# Patient Record
Sex: Female | Born: 1965 | Race: Black or African American | Hispanic: No | Marital: Single | Smoking: Current some day smoker
Health system: Southern US, Community
[De-identification: ages and names within clinical notes are randomized; demographics above are authoritative.]

## PROBLEM LIST (undated history)

## (undated) DIAGNOSIS — M1990 Unspecified osteoarthritis, unspecified site: Secondary | ICD-10-CM

## (undated) DIAGNOSIS — K219 Gastro-esophageal reflux disease without esophagitis: Secondary | ICD-10-CM

## (undated) DIAGNOSIS — G8929 Other chronic pain: Secondary | ICD-10-CM

## (undated) DIAGNOSIS — Z5189 Encounter for other specified aftercare: Secondary | ICD-10-CM

## (undated) DIAGNOSIS — E7800 Pure hypercholesterolemia, unspecified: Secondary | ICD-10-CM

## (undated) HISTORY — DX: Unspecified osteoarthritis, unspecified site: M19.90

## (undated) HISTORY — DX: Other chronic pain: G89.29

## (undated) HISTORY — PX: TUBAL LIGATION: SHX77

## (undated) HISTORY — PX: COLONOSCOPY: SHX174

## (undated) HISTORY — DX: Encounter for other specified aftercare: Z51.89

---

## 1994-01-07 DIAGNOSIS — Z5189 Encounter for other specified aftercare: Secondary | ICD-10-CM

## 1994-01-07 HISTORY — DX: Encounter for other specified aftercare: Z51.89

## 2001-10-05 ENCOUNTER — Encounter (HOSPITAL_COMMUNITY): Admission: RE | Admit: 2001-10-05 | Discharge: 2001-11-04 | Payer: Self-pay | Admitting: General Surgery

## 2001-10-05 ENCOUNTER — Encounter: Payer: Self-pay | Admitting: General Surgery

## 2002-03-20 ENCOUNTER — Emergency Department (HOSPITAL_COMMUNITY): Admission: EM | Admit: 2002-03-20 | Discharge: 2002-03-20 | Payer: Self-pay | Admitting: Emergency Medicine

## 2002-07-20 ENCOUNTER — Encounter: Payer: Self-pay | Admitting: Emergency Medicine

## 2002-07-20 ENCOUNTER — Emergency Department (HOSPITAL_COMMUNITY): Admission: EM | Admit: 2002-07-20 | Discharge: 2002-07-20 | Payer: Self-pay | Admitting: Emergency Medicine

## 2003-03-10 ENCOUNTER — Ambulatory Visit (HOSPITAL_COMMUNITY): Admission: RE | Admit: 2003-03-10 | Discharge: 2003-03-10 | Payer: Self-pay | Admitting: General Surgery

## 2003-03-17 ENCOUNTER — Ambulatory Visit (HOSPITAL_COMMUNITY): Admission: RE | Admit: 2003-03-17 | Discharge: 2003-03-17 | Payer: Self-pay | Admitting: General Surgery

## 2004-09-12 ENCOUNTER — Emergency Department (HOSPITAL_COMMUNITY): Admission: EM | Admit: 2004-09-12 | Discharge: 2004-09-12 | Payer: Self-pay | Admitting: Emergency Medicine

## 2005-01-23 ENCOUNTER — Emergency Department (HOSPITAL_COMMUNITY): Admission: EM | Admit: 2005-01-23 | Discharge: 2005-01-23 | Payer: Self-pay | Admitting: Emergency Medicine

## 2006-02-21 ENCOUNTER — Ambulatory Visit (HOSPITAL_COMMUNITY): Admission: RE | Admit: 2006-02-21 | Discharge: 2006-02-21 | Payer: Self-pay | Admitting: Family Medicine

## 2006-03-14 ENCOUNTER — Ambulatory Visit: Payer: Self-pay | Admitting: Gastroenterology

## 2006-03-19 ENCOUNTER — Ambulatory Visit: Payer: Self-pay | Admitting: Gastroenterology

## 2006-03-19 ENCOUNTER — Ambulatory Visit (HOSPITAL_COMMUNITY): Admission: RE | Admit: 2006-03-19 | Discharge: 2006-03-19 | Payer: Self-pay | Admitting: Gastroenterology

## 2006-03-19 HISTORY — PX: ESOPHAGOGASTRODUODENOSCOPY: SHX1529

## 2006-03-24 ENCOUNTER — Ambulatory Visit: Payer: Self-pay | Admitting: Orthopedic Surgery

## 2006-04-03 ENCOUNTER — Encounter (HOSPITAL_COMMUNITY): Admission: RE | Admit: 2006-04-03 | Discharge: 2006-05-03 | Payer: Self-pay | Admitting: Orthopedic Surgery

## 2006-05-06 ENCOUNTER — Encounter (HOSPITAL_COMMUNITY): Admission: RE | Admit: 2006-05-06 | Discharge: 2006-06-05 | Payer: Self-pay | Admitting: Orthopedic Surgery

## 2006-05-12 ENCOUNTER — Ambulatory Visit: Payer: Self-pay | Admitting: Gastroenterology

## 2006-05-12 ENCOUNTER — Ambulatory Visit: Payer: Self-pay | Admitting: Orthopedic Surgery

## 2006-05-22 ENCOUNTER — Ambulatory Visit (HOSPITAL_COMMUNITY): Admission: RE | Admit: 2006-05-22 | Discharge: 2006-05-22 | Payer: Self-pay | Admitting: Orthopedic Surgery

## 2006-05-29 ENCOUNTER — Ambulatory Visit: Payer: Self-pay | Admitting: Orthopedic Surgery

## 2006-09-01 ENCOUNTER — Ambulatory Visit: Payer: Self-pay | Admitting: Orthopedic Surgery

## 2006-09-15 ENCOUNTER — Encounter: Admission: RE | Admit: 2006-09-15 | Discharge: 2006-09-15 | Payer: Self-pay | Admitting: Orthopedic Surgery

## 2007-03-02 ENCOUNTER — Other Ambulatory Visit: Admission: RE | Admit: 2007-03-02 | Discharge: 2007-03-02 | Payer: Self-pay | Admitting: Obstetrics & Gynecology

## 2007-04-12 ENCOUNTER — Emergency Department (HOSPITAL_COMMUNITY): Admission: EM | Admit: 2007-04-12 | Discharge: 2007-04-12 | Payer: Self-pay | Admitting: Emergency Medicine

## 2008-02-01 ENCOUNTER — Other Ambulatory Visit: Admission: RE | Admit: 2008-02-01 | Discharge: 2008-02-01 | Payer: Self-pay | Admitting: Obstetrics & Gynecology

## 2008-02-10 ENCOUNTER — Ambulatory Visit (HOSPITAL_COMMUNITY): Admission: RE | Admit: 2008-02-10 | Discharge: 2008-02-10 | Payer: Self-pay | Admitting: Obstetrics & Gynecology

## 2008-07-20 ENCOUNTER — Ambulatory Visit (HOSPITAL_COMMUNITY): Admission: RE | Admit: 2008-07-20 | Discharge: 2008-07-20 | Payer: Self-pay | Admitting: Family Medicine

## 2009-02-10 ENCOUNTER — Ambulatory Visit (HOSPITAL_COMMUNITY): Admission: RE | Admit: 2009-02-10 | Discharge: 2009-02-10 | Payer: Self-pay | Admitting: Family Medicine

## 2009-03-24 ENCOUNTER — Other Ambulatory Visit: Admission: RE | Admit: 2009-03-24 | Discharge: 2009-03-24 | Payer: Self-pay | Admitting: Obstetrics & Gynecology

## 2009-12-20 ENCOUNTER — Emergency Department (HOSPITAL_COMMUNITY)
Admission: EM | Admit: 2009-12-20 | Discharge: 2009-12-20 | Payer: Self-pay | Source: Home / Self Care | Admitting: Emergency Medicine

## 2010-01-24 ENCOUNTER — Ambulatory Visit (HOSPITAL_COMMUNITY)
Admission: RE | Admit: 2010-01-24 | Discharge: 2010-01-24 | Payer: Self-pay | Source: Home / Self Care | Attending: Family Medicine | Admitting: Family Medicine

## 2010-01-25 ENCOUNTER — Other Ambulatory Visit (HOSPITAL_COMMUNITY): Payer: Self-pay | Admitting: Family Medicine

## 2010-01-25 DIAGNOSIS — Z139 Encounter for screening, unspecified: Secondary | ICD-10-CM

## 2010-01-28 ENCOUNTER — Encounter: Payer: Self-pay | Admitting: General Surgery

## 2010-01-29 ENCOUNTER — Encounter: Payer: Self-pay | Admitting: Orthopedic Surgery

## 2010-02-12 ENCOUNTER — Ambulatory Visit (HOSPITAL_COMMUNITY)
Admission: RE | Admit: 2010-02-12 | Discharge: 2010-02-12 | Disposition: A | Discharge status: Home / Self Care | Payer: Medicare Other | Source: Ambulatory Visit | Attending: Family Medicine | Admitting: Family Medicine

## 2010-02-12 ENCOUNTER — Ambulatory Visit (HOSPITAL_COMMUNITY): Admission: RE | Admit: 2010-02-12 | Payer: Medicare Other | Source: Home / Self Care | Admitting: Family Medicine

## 2010-02-12 DIAGNOSIS — Z139 Encounter for screening, unspecified: Secondary | ICD-10-CM

## 2010-02-12 DIAGNOSIS — Z1231 Encounter for screening mammogram for malignant neoplasm of breast: Secondary | ICD-10-CM | POA: Insufficient documentation

## 2010-03-27 ENCOUNTER — Other Ambulatory Visit (HOSPITAL_COMMUNITY)
Admission: RE | Admit: 2010-03-27 | Discharge: 2010-03-27 | Disposition: A | Discharge status: Home / Self Care | Payer: Medicare Other | Source: Ambulatory Visit | Attending: Obstetrics & Gynecology | Admitting: Obstetrics & Gynecology

## 2010-03-27 ENCOUNTER — Other Ambulatory Visit: Payer: Self-pay | Admitting: Obstetrics & Gynecology

## 2010-03-27 DIAGNOSIS — Z124 Encounter for screening for malignant neoplasm of cervix: Secondary | ICD-10-CM | POA: Insufficient documentation

## 2010-05-22 NOTE — Consult Note (Signed)
Kelsey Grant, Kelsey Grant           ACCOUNT NO.:  [id number]   MEDICAL RECORD NO.:  [id number]          PATIENT TYPE:  DAY   LOCATION:  AMB                           FACILITY:  [facility]   PHYSICIAN:  Kassie Mends, M.D.      DATE OF BIRTH:  1965-12-18   DATE OF CONSULTATION:  05/12/2006  DATE OF DISCHARGE:                                 CONSULTATION   REFERRING PHYSICIAN:  Oval Linsey, MD.   PROBLEM LIST:  1. Esophageal stricture dilated in March 2008.  2. Gastroesophageal reflux disease.  3. Family history of colorectal cancer diagnosed at age less than 23      in a first-degree relative.   SUBJECTIVE:  Kelsey Grant is a 45 year old female who presented with  solid dysphagia.  She had Savary dilation in March 2008  to 15 mm.  She  states she that has been eating pretty good.  She has been burping a  lot.  She is able to eat chicken and fish.  Her chicken is boiled or  baked, but in the form of chicken breast.  She does have a couple of  molars missing in the back and her top set of teeth.  She denies any  food getting stuck when it goes down.  She does have uncontrolled reflux  if she does not take her omeprazole.  She feels like foods comes back  into her esophagus if she is not taking her omeprazole.   MEDICATIONS:  1. Omeprazole 20 mg daily.  2. Hydrocodone 5/500 as needed for hip pain.  3. Naproxen fiber mg twice a day as needed for hip pain.   OBJECTIVE:  Weight 148 pounds (unchanged since March 2008), height 5  feet 2 inches, temperature 98.3, blood pressure 110/76, pulse 60.  GENERAL:  She is in no apparent distress, alert and oriented x4.  LUNGS:  Clear to auscultation bilaterally.  CARDIOVASCULAR:  Regular rhythm.  No murmur.  Normal S1 and S2.  ABDOMEN:  Bowel sounds present, soft, nontender, nondistended.  No  rebound or guarding.   ASSESSMENT:  Kelsey Grant is a 45 year old female with solid dysphagia  who was dilated to 15 mm, who has less than ideal dentition  and would  benefit from additional dilation.  She is unable to be sedated  adequately with conscious sedation and has failed multiple attempts to  be sedated in the past with conscious sedation.  She was successfully  sedated in March 2008 with propofol.  Her symptoms are adequately  controlled with omeprazole 20 mg daily.   Thank you for allowing me to see Kelsey Grant in consultation.  My  recommendations follow.   RECOMMENDATIONS:  1..  EGD with Savary dilation and sedation provided  with propofol within the next 2 weeks.  1. Continue omeprazole for gastroesophageal reflux disease.  She was      also given a [employer] for self-      care for gastroesophageal reflux disease.  2. She was given a gas and prevention [facility] gastroenterology      handout.  3. Return patient visit to 2 months.  Kassie Mends, M.D.     SM/MEDQ  D:  05/12/2006  T:  05/12/2006  Job:  [id number]   cc:   Melvyn Novas, MD  Fax: [phone]

## 2010-05-22 NOTE — Consult Note (Signed)
NAMEDESHONDRA, WORST           ACCOUNT NO.:  [id number]   MEDICAL RECORD NO.:  [id number]          PATIENT TYPE:  DAY   LOCATION:  AMB                           FACILITY:  [facility]   PHYSICIAN:  Kassie Mends, M.D.      DATE OF BIRTH:  1965-01-09   DATE OF CONSULTATION:  05/12/2006  DATE OF DISCHARGE:                                 CONSULTATION   REFERRING PHYSICIAN:  Oval Linsey, MD.   PROBLEM LIST:  1. Esophageal stricture dilated in [id number].  2. Gastroesophageal reflux disease.  3. Family history of colorectal cancer diagnosed at age less than 28      in a first-degree relative.   SUBJECTIVE:  Ms. Baldridge is a 45 year old female who presented with  solid dysphagia.  She had Savary dilation in March 2008  to 15 mm.  She  states she that has been eating pretty good.  She has been burping a  lot.  She is able to eat chicken and fish.  Her chicken is boiled or  baked, but in the form of chicken breast.  She does have a couple of  molars missing in the back and her top set of teeth.  She denies any  food getting stuck when it goes down.  She does have uncontrolled reflux  if she does not take her omeprazole.  She feels like foods comes back  into her esophagus if she is not taking her omeprazole.   MEDICATIONS:  1. Omeprazole 20 mg daily.  2. Hydrocodone 5/500 as needed for hip pain.  3. Naproxen 500 mg twice a day as needed for hip pain.   OBJECTIVE:  Weight 148 pounds (unchanged since [id number]), height 5  feet 2 inches, temperature 98.3, blood pressure 110/76, pulse 60.  GENERAL:  She is in no apparent distress, alert and oriented x4.  LUNGS:  Clear to auscultation bilaterally.  CARDIOVASCULAR:  Regular rhythm.  No  murmur.  Normal S1 and S2.  ABDOMEN:  Bowel sounds present, soft, nontender, nondistended.  No  rebound or guarding.   ASSESSMENT:  Ms. Weill is a 45 year old female with solid dysphagia  who was dilated to 15 mm, who has less than ideal dentition and  would  benefit from additional dilation.  She  has failed multiple attempts to  be sedated in the past with conscious sedation.  She was successfully  sedated in [id number] with propofol.  Her symptoms are adequately  controlled with omeprazole 20 mg daily.  Thank you for allowing me to  see Ms. Sheehy in consultation.  My recommendations follow.   RECOMMENDATIONS:  1. EGD with Savary dilation and sedation provided with propofol within      the next 2 weeks.  2. Continue omeprazole for gastroesophageal reflux disease.  She was      also given a [employer] for self-      care for gastroesophageal reflux disease.  3. She was given a gas and prevention [facility] gastroenterology      handout.  4. Return patient visit to 2 months.      Kassie Mends, M.D.  Electronically Signed  SM/MEDQ  D:  05/12/2006  T:  05/12/2006  Job:  [id number]   cc:   Melvyn Novas, MD  Fax: [phone]

## 2010-05-25 NOTE — Op Note (Signed)
Kelsey Grant, QUANT           ACCOUNT NO.:  [id number]   MEDICAL RECORD NO.:  [id number]          PATIENT TYPE:  AMB   LOCATION:  DAY                           FACILITY:  [facility]   PHYSICIAN:  Kassie Mends, M.D.      DATE OF BIRTH:  03-26-1965   DATE OF PROCEDURE:  03/19/2006  DATE OF DISCHARGE:  03/19/2006                               OPERATIVE REPORT   REFERRING PHYSICIAN:  Melvyn Novas, MD   PROCEDURE:  Esophagogastroduodenoscopy with Savary dilation.   INDICATION FOR EXAM:  Kelsey Grant is a 45 year old female who presents  with dysphagia.   FINDINGS:  1. Distal esophageal stricture.  Savary dilation performed      successively with the Savary dilators from 12.8 mm to 15 mm with      increasing resistance.  The last dilator passed with moderate      resistance.  A small tear was created in the proximal gastric wall      when the retroflexed view of the fundus and cardia was performed.  2. Otherwise, the distal esophagus was without evidence of Barrett's,      the body and the antrum of the stomach were without erosion or      ulcerations.  Normal duodenal bulb and second portion of the      duodenum.   RECOMMENDATIONS:  1. Clear liquids for 12 hours then soft mechanical diet.  All meats      need to be shredded, chopped or pulled.  2. No aspirin, NSAIDs or anticoagulation for 14 days.  3. Follow-up appointment with Dr. Cira Servant in 1 month.   MEDICATIONS:  Provided by anesthesia due to the patient having failed  attempt at two EGD via conscious sedation due to agitation and inability  to be sedated via conscious sedation.   PROCEDURE TECHNIQUE:  Physical exam was performed and informed consent  was obtained from the patient after explaining the benefits, risks and  alternatives to the procedure.  The patient was connected to monitor and  placed in the left lateral position.  Continuous oxygen was provided by  nasal cannula and IV medicine administered through  an indwelling  cannula.  After administration of sedation by anesthesia, the patient's  esophagus intubated and  the scope was advanced under direct visualization to second portion of  the duodenum.  The scope was subsequently removed slowly by carefully  examining the color, texture, anatomy and integrity of the mucosa on the  way out.  The patient was recovered in endoscopy suite and discharged  home in satisfactory condition.      Kassie Mends, M.D.  Electronically Signed     SM/MEDQ  D:  03/25/2006  T:  03/26/2006  Job:  [id number]   cc:   Melvyn Novas, MD  Fax: [phone]

## 2010-05-25 NOTE — Consult Note (Signed)
NAMEELIZABETHANNE, Grant           ACCOUNT NO.:  [id number]   MEDICAL RECORD NO.:  [id number]          PATIENT TYPE:  AMB   LOCATION:  DAY                           FACILITY:  [facility]   PHYSICIAN:  Kassie Mends, M.D.      DATE OF BIRTH:  04/19/1965   DATE OF CONSULTATION:  03/14/2006  DATE OF DISCHARGE:                                 CONSULTATION   REFERRING PHYSICIAN:  Melvyn Novas, M.D.   REASON FOR CONSULTATION:  Esophageal stricture.   HISTORY OF PRESENT ILLNESS:  [name] is a 45 year old female who  complains of food stopping in the middle of her chest.  She states that  Dr. Katrinka Blazing tried twice to do an upper endoscopy on her within the last  year or two, but she was unable to be sedated because she kept fighting  and the procedure was never performed.  She has just managed her  symptoms by eating and waiting for the food to go down.  She does have a  history of heartburn and she takes omeprazole 20 mg a day.  She denies  any blood in her stool or black tarry stools.  She has not had any  abdominal pain, constipation, or diarrhea.   PAST MEDICAL HISTORY:  Thumb fracture.   PAST SURGICAL HISTORY:  Laser surgery.   ALLERGIES:  NO KNOWN DRUG ALLERGIES.   MEDICATIONS:  1. Omeprazole 20 mg daily.  2. Hydrocodone 5/500, three times a day as needed for hip pain.  3. Naproxen 500 mg b.i.d. as needed for hip pain.   FAMILY HISTORY:  She has a family history of colorectal cancer in her  father at age less than 37.  She is not sure of the exact age when he  was diagnosed.   SOCIAL HISTORY:  She is not married and not employed.  She smokes  sometimes.  When asked about her alcohol, she states she does not really  drink that often.   REVIEW OF SYSTEMS:  Per the HPI, otherwise all systems are negative.   PHYSICAL EXAMINATION:  VITAL SIGNS:  Weight 148 pounds, height 5 feet 2  inches, BMI of 27.1 (overweight), temperature 97.9, blood pressure  120/76, pulse 68.   GENERAL:  She is in no apparent distress, alert and  orient x4.  HEENT:  Atraumatic, normocephalic.  Pupils equal and reactive to light.  Mouth:  No oral lesions.  Posterior pharynx without erythema or  exudate.NECK:  Full range of motion.  No lymphadenopathy.  LUNGS:  Clear  to auscultation bilaterally.  CARDIOVASCULAR:  Regular rhythm.  No murmur.  Normal S1-S2.  ABDOMEN:  Bowel sounds are present.  Soft, nontender, nondistended.  No rebound,  no guarding, no hepatosplenomegaly, no abdominal bruits or pulsatile  masses.  EXTREMITIES:  Without cyanosis, clubbing or edema.  NEUROLOGIC:  She has  no focal neurologic deficits.   RADIOGRAPHIC STUDIES:  Upper GI performed on February15, 2008, shows  poor forward contrast peristalsis across the stricture GE junction which  obstructed a 12.5-mm diameter barium tablet.   ASSESSMENT:  Kelsey Grant is a 45 year old female with a distal  esophageal stricture, probably a secondary to gastroesophageal reflux  disease.  She has significant solid dysphagia.Thank you for allowing me  to see Ms. Donahoo in consultation.  My recommendations follow.   RECOMMENDATIONS:  1. Ms. Campisi will be scheduled for an EGD using propofol, because      she has already had two failed attempts at EGD using conscious      sedation.  2. She is asked to follow a soft diet.  She is only to eat chopped or      shredded needs.  3. I spent 15 minutes explaining to Ms. Vantol the nature of the      upper endoscopy, why we should do it with propofol, and why she      needs to follow a soft diet.  I did discuss with her the risk of      aspiration and explained what aspiration was if she does not follow      the soft diet recommendations.  She understands that if her      esophagus is full of food she may aspirate which is getting food      into her lungs and get pneumonia.  4. I also asked her to specifically ask her father what age he was      diagnosed with colon  cancer, and she knows then she should have a      colonoscopy 10 years younger than the age that he was diagnosed      with colon cancer.  She understands that her siblings, brothers and      sisters, should also have a colonoscopy at an age 48 years younger      than when he was diagnosed with his colon cancer.  5. I will obtain a CBC, BMP, and mag on today.  6. She has a followup appointment to see me in 2 months.      Kassie Mends, M.D.  Electronically Signed     SM/MEDQ  D:  03/14/2006  T:  03/14/2006  Job:  [id number]   cc:   Melvyn Novas, MD  Fax: [phone]

## 2010-05-25 NOTE — H&P (Signed)
NAME:  Kelsey Grant, Kelsey Grant                     ACCOUNT NO.:  [id number]   MEDICAL RECORD NO.:  [id number]                   PATIENT TYPE:  AMB   LOCATION:  DAY                                  FACILITY:  [facility]   PHYSICIAN:  Leroy C. Katrinka Blazing, M.D.                DATE OF BIRTH:  23-Jul-1965   DATE OF ADMISSION:  DATE OF DISCHARGE:                                HISTORY & PHYSICAL   HISTORY OF PRESENT ILLNESS:  69 five-year-old female with history of  severe gastroesophageal reflux disease.  She is having increasing heartburn  and dysphagia.  She has documented peptic ulcer disease and with an antral  gastritis.  She also has Grant history of distal esophageal ulcer with  stricture.  She has not been scoped over the past 5 years.  She is having  increasing dysphagia and is scheduled for upper endoscopy and probable  esophageal dilatation.   PAST MEDICAL HISTORY:  1. 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  2. She has chronic low back pain.  3. Hypertension.   MEDICATIONS:  1. Zanaflex 4 mg 2 tabs b.i.d.  2. Prevacid 30 mg daily.  3. Reglan 10 mg Grant.c. and h.s.   EXAMINATION:  VITAL SIGNS:  Blood pressure 120/80, pulse 68, respirations  16.  Weight 159 pounds.  HEENT:  Unremarkable.  NECK:  Neck is supple with no JVD or bruit.  CHEST:  Chest clear to auscultation.  No rales, rubs, rhonchi or wheezes.  HEART:  Regular rate and rhythm without murmur, gallop or rub.  ABDOMEN:  Abdomen soft and nontender.  No masses.  EXTREMITIES:  Decreased range of motion at the hips, mild tenderness to both  hips laterally and mild tenderness over SI joints.  NEUROLOGIC:  No focal motor, sensory or cerebellar deficit.   IMPRESSION:  1. Gastroesophageal reflux disease with progressive dysphagia.  2. Peptic ulcer disease with Grant history of acute gastritis.  3. Hypertension.  4. Osteoarthritis.  5. Chronic low back pain.   PLAN:  Upper endoscopy with probable esophageal dilatation.     ___________________________________________                                         Dirk Dress Katrinka Blazing, M.D.   LCS/MEDQ  D:  03/09/2003  T:  03/10/2003  Job:  [id number]

## 2010-05-26 ENCOUNTER — Emergency Department (HOSPITAL_COMMUNITY)
Admission: EM | Admit: 2010-05-26 | Discharge: 2010-05-26 | Disposition: A | Discharge status: Home / Self Care | Payer: No Typology Code available for payment source | Attending: Emergency Medicine | Admitting: Emergency Medicine

## 2010-05-26 DIAGNOSIS — S139XXA Sprain of joints and ligaments of unspecified parts of neck, initial encounter: Secondary | ICD-10-CM | POA: Insufficient documentation

## 2010-05-26 DIAGNOSIS — S336XXA Sprain of sacroiliac joint, initial encounter: Secondary | ICD-10-CM | POA: Insufficient documentation

## 2010-05-26 DIAGNOSIS — Y92410 Unspecified street and highway as the place of occurrence of the external cause: Secondary | ICD-10-CM | POA: Insufficient documentation

## 2010-07-16 ENCOUNTER — Emergency Department (HOSPITAL_COMMUNITY): Payer: Medicare Other

## 2010-07-16 ENCOUNTER — Emergency Department (HOSPITAL_COMMUNITY)
Admission: EM | Admit: 2010-07-16 | Discharge: 2010-07-16 | Disposition: A | Discharge status: Home / Self Care | Payer: Medicare Other | Attending: Emergency Medicine | Admitting: Emergency Medicine

## 2010-07-16 DIAGNOSIS — S161XXA Strain of muscle, fascia and tendon at neck level, initial encounter: Secondary | ICD-10-CM

## 2010-07-16 DIAGNOSIS — E7800 Pure hypercholesterolemia, unspecified: Secondary | ICD-10-CM | POA: Insufficient documentation

## 2010-07-16 DIAGNOSIS — S139XXA Sprain of joints and ligaments of unspecified parts of neck, initial encounter: Secondary | ICD-10-CM | POA: Insufficient documentation

## 2010-07-16 DIAGNOSIS — K219 Gastro-esophageal reflux disease without esophagitis: Secondary | ICD-10-CM | POA: Insufficient documentation

## 2010-07-16 DIAGNOSIS — Z79899 Other long term (current) drug therapy: Secondary | ICD-10-CM | POA: Insufficient documentation

## 2010-07-16 DIAGNOSIS — F17200 Nicotine dependence, unspecified, uncomplicated: Secondary | ICD-10-CM | POA: Insufficient documentation

## 2010-07-16 HISTORY — DX: Gastro-esophageal reflux disease without esophagitis: K21.9

## 2010-07-16 HISTORY — DX: Pure hypercholesterolemia, unspecified: E78.00

## 2010-07-16 MED ORDER — CELECOXIB 100 MG PO CAPS: 100.0000 mg | ORAL_CAPSULE | Freq: Two times a day (BID) | ORAL | Status: AC
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
## 2010-07-16 NOTE — ED Notes (Signed)
Pt presents with pain to neck, shoulders back, and pt states pain radiates down to feet. Pt states she was in [facility] 06/25/2010. Pt was seen here. Pt has since followed up with PMD and cleared but pt feels "something isn't right".

## 2010-10-25 ENCOUNTER — Encounter (HOSPITAL_COMMUNITY): Payer: Self-pay | Admitting: Emergency Medicine

## 2010-10-25 ENCOUNTER — Emergency Department (HOSPITAL_COMMUNITY)
Admission: EM | Admit: 2010-10-25 | Discharge: 2010-10-25 | Disposition: A | Discharge status: Home / Self Care | Payer: Medicare Other | Attending: Emergency Medicine | Admitting: Emergency Medicine

## 2010-10-25 ENCOUNTER — Other Ambulatory Visit: Payer: Self-pay

## 2010-10-25 DIAGNOSIS — M25511 Pain in right shoulder: Secondary | ICD-10-CM

## 2010-10-25 DIAGNOSIS — M25519 Pain in unspecified shoulder: Secondary | ICD-10-CM | POA: Insufficient documentation

## 2010-10-25 DIAGNOSIS — K219 Gastro-esophageal reflux disease without esophagitis: Secondary | ICD-10-CM | POA: Insufficient documentation

## 2010-10-25 DIAGNOSIS — M542 Cervicalgia: Secondary | ICD-10-CM | POA: Insufficient documentation

## 2010-10-25 DIAGNOSIS — Z87828 Personal history of other (healed) physical injury and trauma: Secondary | ICD-10-CM | POA: Insufficient documentation

## 2010-10-25 DIAGNOSIS — F17200 Nicotine dependence, unspecified, uncomplicated: Secondary | ICD-10-CM | POA: Insufficient documentation

## 2010-10-25 DIAGNOSIS — E7800 Pure hypercholesterolemia, unspecified: Secondary | ICD-10-CM | POA: Insufficient documentation

## 2010-10-25 DIAGNOSIS — M79601 Pain in right arm: Secondary | ICD-10-CM

## 2010-10-25 DIAGNOSIS — M79609 Pain in unspecified limb: Secondary | ICD-10-CM | POA: Insufficient documentation

## 2010-10-25 MED ORDER — DIAZEPAM 5 MG PO TABS
5.0000 mg | ORAL_TABLET | Freq: Once | ORAL | Status: DC
  Filled 2010-10-25: qty 1

## 2010-10-25 MED ORDER — IBUPROFEN 400 MG PO TABS
400.0000 mg | ORAL_TABLET | Freq: Once | ORAL | Status: AC
  Administered 2010-10-25: 400 mg via ORAL
  Filled 2010-10-25: qty 1

## 2010-10-25 NOTE — ED Notes (Signed)
Pt c/o right arm, breast and shoulder pain x 3 weeks.
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
## 2011-02-20 ENCOUNTER — Other Ambulatory Visit (HOSPITAL_COMMUNITY): Payer: Self-pay | Admitting: Family Medicine

## 2011-02-20 DIAGNOSIS — Z139 Encounter for screening, unspecified: Secondary | ICD-10-CM

## 2011-02-21 ENCOUNTER — Ambulatory Visit (HOSPITAL_COMMUNITY)
Admission: RE | Admit: 2011-02-21 | Discharge: 2011-02-21 | Disposition: A | Discharge status: Home / Self Care | Payer: Medicare Other | Source: Ambulatory Visit | Attending: Family Medicine | Admitting: Family Medicine

## 2011-02-21 DIAGNOSIS — Z139 Encounter for screening, unspecified: Secondary | ICD-10-CM

## 2011-02-21 DIAGNOSIS — Z1231 Encounter for screening mammogram for malignant neoplasm of breast: Secondary | ICD-10-CM | POA: Insufficient documentation

## 2011-03-04 ENCOUNTER — Encounter (HOSPITAL_COMMUNITY): Payer: Self-pay | Admitting: *Deleted

## 2011-03-04 ENCOUNTER — Emergency Department (HOSPITAL_COMMUNITY): Payer: Medicare Other

## 2011-03-04 ENCOUNTER — Emergency Department (HOSPITAL_COMMUNITY)
Admission: EM | Admit: 2011-03-04 | Discharge: 2011-03-04 | Disposition: A | Discharge status: Home / Self Care | Payer: Medicare Other | Attending: Emergency Medicine | Admitting: Emergency Medicine

## 2011-03-04 DIAGNOSIS — M5380 Other specified dorsopathies, site unspecified: Secondary | ICD-10-CM | POA: Insufficient documentation

## 2011-03-04 DIAGNOSIS — E7800 Pure hypercholesterolemia, unspecified: Secondary | ICD-10-CM | POA: Insufficient documentation

## 2011-03-04 DIAGNOSIS — W108XXA Fall (on) (from) other stairs and steps, initial encounter: Secondary | ICD-10-CM | POA: Insufficient documentation

## 2011-03-04 DIAGNOSIS — M542 Cervicalgia: Secondary | ICD-10-CM | POA: Insufficient documentation

## 2011-03-04 DIAGNOSIS — K219 Gastro-esophageal reflux disease without esophagitis: Secondary | ICD-10-CM | POA: Insufficient documentation

## 2011-03-04 DIAGNOSIS — M7918 Myalgia, other site: Secondary | ICD-10-CM

## 2011-03-04 DIAGNOSIS — M545 Low back pain, unspecified: Secondary | ICD-10-CM | POA: Insufficient documentation

## 2011-03-04 DIAGNOSIS — IMO0001 Reserved for inherently not codable concepts without codable children: Secondary | ICD-10-CM | POA: Insufficient documentation

## 2011-03-04 DIAGNOSIS — Z79899 Other long term (current) drug therapy: Secondary | ICD-10-CM | POA: Insufficient documentation

## 2011-03-04 DIAGNOSIS — M546 Pain in thoracic spine: Secondary | ICD-10-CM | POA: Insufficient documentation

## 2011-03-04 DIAGNOSIS — M25519 Pain in unspecified shoulder: Secondary | ICD-10-CM | POA: Insufficient documentation

## 2011-03-04 DIAGNOSIS — S335XXA Sprain of ligaments of lumbar spine, initial encounter: Secondary | ICD-10-CM | POA: Insufficient documentation

## 2011-03-04 DIAGNOSIS — Y92009 Unspecified place in unspecified non-institutional (private) residence as the place of occurrence of the external cause: Secondary | ICD-10-CM | POA: Insufficient documentation

## 2011-03-04 DIAGNOSIS — S39012A Strain of muscle, fascia and tendon of lower back, initial encounter: Secondary | ICD-10-CM

## 2011-03-04 DIAGNOSIS — M25559 Pain in unspecified hip: Secondary | ICD-10-CM | POA: Insufficient documentation

## 2011-03-04 MED ORDER — METHOCARBAMOL 500 MG PO TABS: 1000.0000 mg | ORAL_TABLET | Freq: Three times a day (TID) | ORAL | Status: AC

## 2011-03-04 NOTE — ED Notes (Signed)
Denies LOC

## 2011-03-04 NOTE — ED Notes (Signed)
Fell on Friday and next day had left sided neck pain and left leg pain, left side with pain per pt.

## 2011-03-04 NOTE — ED Notes (Signed)
Slipped on ice on Friday.  Pain in neck, lt side of body, especially lt hip area. Ambulatory, Alert, NAD.

## 2011-03-04 NOTE — ED Provider Notes (Signed)
History     CSN: [id number]  Arrival date & time 03/04/11  1101   First MD Initiated Contact with Patient 03/04/11 1234      Chief Complaint  Patient presents with  . Fall  . Neck Pain    (Consider location/radiation/quality/duration/timing/severity/associated sxs/prior treatment) Patient is a 46 y.o. female presenting with fall. The history is provided by the patient.  Fall The accident occurred 2 days ago. The fall occurred while walking (Patient slipped walking down her front steps 2 days ago.  She landed on her left side causing pain in her hip and lower back.  Yesterday she also developed pain and tightness in her upper back and neck area.). She landed on concrete. The point of impact was the left hip. Pain location: Lower back with radiation to left hip and buttock, left neck and upper shoulder. The pain is at a severity of 8/10. The pain is moderate. She was ambulatory at the scene. Pertinent negatives include no fever, no numbness, no abdominal pain, no bowel incontinence, no nausea, no headaches, no loss of consciousness and no tingling. The symptoms are aggravated by flexion and standing (Palpation and movement makes symptoms worse.). She has tried rest, heat and acetaminophen for the symptoms. The treatment provided no relief.    Past Medical History  Diagnosis Date  . GERD (gastroesophageal reflux disease)   . Hypercholesteremia     Past Surgical History  Procedure Date  . Cesarean section   . Bony pelvis surgery     No family history on file.  History  Substance Use Topics  . Smoking status: Former Smoker    Types: Cigars  . Smokeless tobacco: Not on file  . Alcohol Use: Yes    OB History    Grav Para Term Preterm Abortions TAB SAB Ect Mult Living                  Review of Systems  Constitutional: Negative for fever.  HENT: Negative for congestion, sore throat and neck pain.   Eyes: Negative.   Respiratory: Negative for chest tightness and shortness  of breath.   Cardiovascular: Negative for chest pain.  Gastrointestinal: Negative for nausea, abdominal pain and bowel incontinence.  Genitourinary: Negative.   Musculoskeletal: Positive for back pain and arthralgias. Negative for joint swelling and gait problem.  Skin: Negative.  Negative for rash and wound.  Neurological: Negative for dizziness, tingling, loss of consciousness, weakness, light-headedness, numbness and headaches.  Hematological: Negative.   Psychiatric/Behavioral: Negative.     Allergies  Review of patient's allergies indicates no known allergies.  Home Medications   Current Outpatient Rx  Name Route Sig Dispense Refill  . ACETAMINOPHEN 500 MG PO TABS Oral Take 500 mg by mouth every 6 (six) hours as needed. Pain     . METHOCARBAMOL 500 MG PO TABS Oral Take 2 tablets (1,000 mg total) by mouth 3 (three) times daily. 30 tablet 0  . NABUMETONE 750 MG PO TABS Oral Take 750 mg by mouth 2 (two) times daily.     Marland Kitchen OMEPRAZOLE 10 MG PO CPDR Oral Take 20 mg by mouth daily.     . OXYCODONE-ACETAMINOPHEN 5-325 MG PO TABS Oral Take 1 tablet by mouth 3 (three) times daily as needed. Pain    . PRAVASTATIN SODIUM 40 MG PO TABS Oral Take 40 mg by mouth daily.       BP 134/86  Pulse 69  Temp(Src) 97.8 F (36.6 C) (Oral)  Resp 20  Ht 5\' 2"  (1.575 m)  Wt 155 lb (70.308 kg)  BMI 28.35 kg/m2  SpO2 100%  LMP 02/25/2011  Physical Exam  Nursing note and vitals reviewed. Constitutional: She is oriented to person, place, and time. She appears well-developed and well-nourished.  HENT:  Head: Normocephalic.  Eyes: Conjunctivae are normal.  Neck: Normal range of motion. Neck supple.  Cardiovascular: Regular rhythm and intact distal pulses.        Pedal pulses normal.  Pulmonary/Chest: Effort normal. She has no wheezes.  Abdominal: Soft. Bowel sounds are normal. She exhibits no distension and no mass.  Musculoskeletal: She exhibits no edema.       Cervical back: She exhibits  decreased range of motion, tenderness and spasm. She exhibits no bony tenderness.       Lumbar back: She exhibits tenderness. She exhibits no swelling, no edema and no spasm.  Neurological: She is alert and oriented to person, place, and time. She has normal strength. She displays no atrophy and no tremor. No cranial nerve deficit or sensory deficit. Gait normal.  Reflex Scores:      Patellar reflexes are 2+ on the right side and 2+ on the left side.      Achilles reflexes are 2+ on the right side and 2+ on the left side.      No strength deficit noted in hip and knee flexor and extensor muscle groups.  Ankle flexion and extension intact.  Skin: Skin is warm and dry.  Psychiatric: She has a normal mood and affect.    ED Course  Procedures (including critical care time)  Labs Reviewed - No data to display Dg Cervical Spine Complete  03/04/2011  *RADIOLOGY REPORT*  Clinical Data: Pain, left-sided  CERVICAL SPINE - COMPLETE 4+ VIEW  Comparison: 07/16/2010  Findings: Alignment is normal.  Soft tissue shadows are normal.  No evidence of degenerative disc disease or degenerative facet disease.  No osteophytic encroachment upon the canal or foramina.  IMPRESSION: Normal radiographs  Original Report Authenticated By: Thomasenia Sales, M.D.   Dg Lumbar Spine Complete  03/04/2011  *RADIOLOGY REPORT*  Clinical Data: Back pain extending to the left leg  LUMBAR SPINE - COMPLETE 4+ VIEW  Comparison: 05/22/2006  Findings: There is very minimal curvature convex to the right. Disc space heights are within normal limits.  There is mild facet degeneration at L4-5 and L5-S1.  No pars defect or slippage. Sacroiliac joints appear normal.  IMPRESSION: Mild facet degeneration L4-5 and L5-S1.  Original Report Authenticated By: Thomasenia Sales, M.D.     1. Myofascial muscle pain   2. Lumbar strain       MDM  Robaxin added to patient's medication regimen.  Encouraged to continue with the Relafen and oxycodone when  necessary.  Heat therapy, avoid movement and activity that worsens pain.  Referral to Dr. Eduard Clos for further management if not improving over the next week.        Candis Musa, PA 03/05/11 [phone]

## 2011-03-05 NOTE — ED Provider Notes (Signed)
Medical screening examination/treatment/procedure(s) were performed by non-physician practitioner and as supervising physician I was immediately available for consultation/collaboration.  Nicoletta Dress. Colon Branch, MD 03/05/11 1052

## 2011-04-02 ENCOUNTER — Other Ambulatory Visit (HOSPITAL_COMMUNITY)
Admission: RE | Admit: 2011-04-02 | Discharge: 2011-04-02 | Disposition: A | Discharge status: Home / Self Care | Payer: Medicare Other | Source: Ambulatory Visit | Attending: Obstetrics & Gynecology | Admitting: Obstetrics & Gynecology

## 2011-04-02 ENCOUNTER — Other Ambulatory Visit: Payer: Self-pay | Admitting: Obstetrics & Gynecology

## 2011-04-02 DIAGNOSIS — Z124 Encounter for screening for malignant neoplasm of cervix: Secondary | ICD-10-CM | POA: Insufficient documentation

## 2011-04-23 ENCOUNTER — Encounter: Payer: Self-pay | Admitting: Gastroenterology

## 2011-04-24 ENCOUNTER — Encounter: Payer: Self-pay | Admitting: Gastroenterology

## 2011-04-24 ENCOUNTER — Ambulatory Visit (INDEPENDENT_AMBULATORY_CARE_PROVIDER_SITE_OTHER): Payer: Medicare Other | Admitting: Gastroenterology

## 2011-04-24 DIAGNOSIS — Z800 Family history of malignant neoplasm of digestive organs: Secondary | ICD-10-CM

## 2011-04-24 DIAGNOSIS — R1310 Dysphagia, unspecified: Secondary | ICD-10-CM
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
## 2011-04-24 NOTE — Patient Instructions (Signed)
We are setting you up for an upper endoscopy with Dr. Darrick Penna in the very near future.  We will have you return after your procedure to set up a colonoscopy sometime in the near future.

## 2011-04-25 ENCOUNTER — Encounter: Payer: Self-pay | Admitting: Gastroenterology

## 2011-04-25 DIAGNOSIS — K222 Esophageal obstruction: Secondary | ICD-10-CM | POA: Insufficient documentation

## 2011-04-25 DIAGNOSIS — Z800 Family history of malignant neoplasm of digestive organs: Secondary | ICD-10-CM | POA: Insufficient documentation

## 2011-04-25 NOTE — Progress Notes (Unsigned)
Pt needs EGD with Propofol due to hx of failed conscious sedation. I did not realize this at time of appt. Please have case moved to the OR. Thanks!!  Also, she needs to have a f/u appt with [place] several weeks after EGD. Need to set up TCS due to FH of colon cancer in father in his early 50s.

## 2011-04-25 NOTE — Assessment & Plan Note (Signed)
Father diagnosed in his early 45s. Pt will need colonoscopy after present symptoms are addressed. Would likely not tolerate prep due to reported dysphagia. Needs follow-up in in our office after EGD to set up TCS. States understanding.

## 2011-04-25 NOTE — Assessment & Plan Note (Signed)
46 year old female with hx of distal esophageal stricture in 2008 requiring dilation. This was done with Propofol due to failed prior attempts at another facility with conscious sedation. Solid food and pill dysphagia, symptom duration unknown, pt states "awhile" but was afraid to tell anyone. Wanted to avoid EGD. However, she would like to proceed at this point. No other concerning symptoms or signs.   Proceed with upper endoscopy in the near future with Dr. Darrick Penna with Propofol. The risks, benefits, and alternatives have been discussed in detail with patient. They have stated understanding and desire to proceed.

## 2011-04-29 ENCOUNTER — Other Ambulatory Visit: Payer: Self-pay | Admitting: Gastroenterology

## 2011-04-29 NOTE — Progress Notes (Signed)
Faxed to PCP

## 2011-04-29 NOTE — Progress Notes (Signed)
Case moved to OR- New instructions mailed - LMOVM for pt to call me back

## 2011-05-02 ENCOUNTER — Encounter: Payer: Self-pay | Admitting: Gastroenterology

## 2011-05-02 NOTE — Progress Notes (Signed)
Pt is aware of OV on 5/22 at 0245

## 2011-05-02 NOTE — Progress Notes (Signed)
Pt is aware of OV on 5/22 at 245pm with SF and appt card was mailed

## 2011-05-08 HISTORY — PX: ESOPHAGOGASTRODUODENOSCOPY: SHX1529

## 2011-05-14 ENCOUNTER — Encounter (HOSPITAL_COMMUNITY): Admission: RE | Admit: 2011-05-14 | Discharge: 2011-05-14 | Payer: Medicare Other | Source: Ambulatory Visit

## 2011-05-14 NOTE — Patient Instructions (Addendum)
20 Kelsey Grant  05/14/2011   Your procedure is scheduled on:  Tuesday, 05/21/11  Report to [facility] at 0630 AM.  Call this number if you have problems the morning of surgery: [phone]   Remember:   Do not eat food:After Midnight.  May have clear liquids:until Midnight .  Clear liquids include soda, tea, black coffee, apple or grape juice, broth.  Take these medicines the morning of surgery with A SIP OF WATER: omeprazole. May take percocet if needed.  Do not wear jewelry, make-up or nail polish.  Do not wear lotions, powders, or perfumes. You may wear deodorant.  Do not bring valuables to the hospital.  Contacts, dentures or bridgework may not be worn into surgery.    Patients discharged the day of surgery will not be allowed to drive home.  Name and phone number of your driver: driver  Special Instructions: follow any special instructions given to you by Dr. Evelina Dun office.   Please read over the following fact sheets that you were given: Pain Booklet, Anesthesia Post-op Instructions and Care and Recovery After Surgery   PATIENT INSTRUCTIONS POST-ANESTHESIA  IMMEDIATELY FOLLOWING SURGERY:  Do not drive or operate machinery for the first twenty four hours after surgery.  Do not make any important decisions for twenty four hours after surgery or while taking narcotic pain medications or sedatives.  If you develop intractable nausea and vomiting or a severe headache please notify your doctor immediately.  FOLLOW-UP:  Please make an appointment with your surgeon as instructed. You do not need to follow up with anesthesia unless specifically instructed to do so.  WOUND CARE INSTRUCTIONS (if applicable):  Keep a dry clean dressing on the anesthesia/puncture wound site if there is drainage.  Once the wound has quit draining you may leave it open to air.  Generally you should leave the bandage intact for twenty four hours unless there is drainage.  If the epidural site drains for more  than 36-48 hours please call the anesthesia department.  QUESTIONS?:  Please feel free to call your physician or the hospital operator if you have any questions, and they will be happy to assist you.         Esophagogastroduodenoscopy This is an endoscopic procedure (a procedure that uses a device like a flexible telescope) that allows your caregiver to view the upper stomach and small bowel. This test allows your caregiver to look at the esophagus. The esophagus carries food from your mouth to your stomach. They can also look at your duodenum. This is the first part of the small intestine that attaches to the stomach. This test is used to detect problems in the bowel such as ulcers and inflammation. PREPARATION FOR TEST Nothing to eat after midnight the day before the test. NORMAL FINDINGS Normal esophagus, stomach, and duodenum. Ranges for normal findings may vary among different laboratories and hospitals. You should always check with your doctor after having lab work or other tests done to discuss the meaning of your test results and whether your values are considered within normal limits. MEANING OF TEST  Your caregiver will go over the test results with you and discuss the importance and meaning of your results, as well as treatment options and the need for additional tests if necessary. OBTAINING THE TEST RESULTS It is your responsibility to obtain your test results. Ask the lab or department performing the test when and how you will get your results. Document Released: 04/26/2004 Document Revised: 12/13/2010 Document  Reviewed: 12/04/2007 [facility] Patient Information 2012 [place], [place].

## 2011-05-15 ENCOUNTER — Encounter (HOSPITAL_COMMUNITY)
Admission: RE | Admit: 2011-05-15 | Discharge: 2011-05-15 | Disposition: A | Discharge status: Home / Self Care | Payer: Medicare Other | Source: Ambulatory Visit | Attending: Gastroenterology | Admitting: Gastroenterology

## 2011-05-15 ENCOUNTER — Encounter (HOSPITAL_COMMUNITY): Payer: Self-pay

## 2011-05-15 LAB — BASIC METABOLIC PANEL
BUN: 11 mg/dL (ref 6–23)
Chloride: 107 mEq/L (ref 96–112)
Creatinine, Ser: 0.78 mg/dL (ref 0.50–1.10)
GFR calc Af Amer: >90 mL/min (ref >90)

## 2011-05-15 LAB — HEMOGLOBIN AND HEMATOCRIT, BLOOD: Hemoglobin: 12.4 g/dL (ref 12.0–15.0)

## 2011-05-20 ENCOUNTER — Ambulatory Visit: Admit: 2011-05-20 | Payer: Medicare Other | Admitting: Gastroenterology

## 2011-05-20 SURGERY — ESOPHAGOGASTRODUODENOSCOPY (EGD) WITH ESOPHAGEAL DILATION
Anesthesia: Moderate Sedation

## 2011-05-21 ENCOUNTER — Ambulatory Visit (HOSPITAL_COMMUNITY): Payer: Medicare Other | Admitting: Anesthesiology

## 2011-05-21 ENCOUNTER — Other Ambulatory Visit: Payer: Self-pay | Admitting: Gastroenterology

## 2011-05-21 ENCOUNTER — Encounter (HOSPITAL_COMMUNITY): Payer: Self-pay

## 2011-05-21 ENCOUNTER — Telehealth: Payer: Self-pay | Admitting: Gastroenterology

## 2011-05-21 ENCOUNTER — Encounter (HOSPITAL_COMMUNITY)
Admission: RE | Disposition: A | Discharge status: Home / Self Care | Payer: Self-pay | Source: Ambulatory Visit | Attending: Gastroenterology

## 2011-05-21 ENCOUNTER — Ambulatory Visit (HOSPITAL_COMMUNITY)
Admission: RE | Admit: 2011-05-21 | Discharge: 2011-05-21 | Disposition: A | Discharge status: Home / Self Care | Payer: Medicare Other | Source: Ambulatory Visit | Attending: Gastroenterology | Admitting: Gastroenterology

## 2011-05-21 ENCOUNTER — Encounter (HOSPITAL_COMMUNITY): Payer: Self-pay | Admitting: Anesthesiology

## 2011-05-21 DIAGNOSIS — K2970 Gastritis, unspecified, without bleeding: Secondary | ICD-10-CM

## 2011-05-21 DIAGNOSIS — K2940 Chronic atrophic gastritis without bleeding: Secondary | ICD-10-CM | POA: Insufficient documentation

## 2011-05-21 DIAGNOSIS — K2990 Gastroduodenitis, unspecified, without bleeding: Secondary | ICD-10-CM

## 2011-05-21 DIAGNOSIS — Z01812 Encounter for preprocedural laboratory examination: Secondary | ICD-10-CM | POA: Insufficient documentation

## 2011-05-21 DIAGNOSIS — R1310 Dysphagia, unspecified: Secondary | ICD-10-CM

## 2011-05-21 DIAGNOSIS — E7800 Pure hypercholesterolemia, unspecified: Secondary | ICD-10-CM | POA: Insufficient documentation

## 2011-05-21 DIAGNOSIS — K222 Esophageal obstruction: Secondary | ICD-10-CM | POA: Insufficient documentation

## 2011-05-21 DIAGNOSIS — Z800 Family history of malignant neoplasm of digestive organs: Secondary | ICD-10-CM

## 2011-05-21 DIAGNOSIS — K449 Diaphragmatic hernia without obstruction or gangrene: Secondary | ICD-10-CM | POA: Insufficient documentation

## 2011-05-21 SURGERY — ESOPHAGOGASTRODUODENOSCOPY (EGD) WITH ESOPHAGEAL DILATION
Anesthesia: Monitor Anesthesia Care

## 2011-05-21 MED ORDER — ONDANSETRON HCL 4 MG/2ML IJ SOLN: 4.0000 mg | Freq: Once | INTRAMUSCULAR | Status: DC | PRN

## 2011-05-21 MED ORDER — MIDAZOLAM HCL 2 MG/2ML IJ SOLN
INTRAMUSCULAR | Status: AC
  Filled 2011-05-21: qty 2

## 2011-05-21 MED ORDER — PROPOFOL 10 MG/ML IV BOLUS
INTRAVENOUS | Status: DC | PRN
  Administered 2011-05-21: 20 mg via INTRAVENOUS

## 2011-05-21 MED ORDER — PROPOFOL 10 MG/ML IV EMUL
INTRAVENOUS | Status: DC | PRN
  Administered 2011-05-21: 100 ug/kg/min via INTRAVENOUS

## 2011-05-21 MED ORDER — STERILE WATER FOR IRRIGATION IR SOLN
Status: DC | PRN
  Administered 2011-05-21: 08:00:00

## 2011-05-21 MED ORDER — LIDOCAINE HCL (PF) 1 % IJ SOLN
INTRAMUSCULAR | Status: AC
  Filled 2011-05-21: qty 5

## 2011-05-21 MED ORDER — FENTANYL CITRATE 0.05 MG/ML IJ SOLN: 25.0000 ug | INTRAMUSCULAR | Status: DC | PRN

## 2011-05-21 MED ORDER — MIDAZOLAM HCL 2 MG/2ML IJ SOLN
1.0000 mg | INTRAMUSCULAR | Status: DC | PRN
  Administered 2011-05-21: 2 mg via INTRAVENOUS

## 2011-05-21 MED ORDER — ONDANSETRON HCL 4 MG/2ML IJ SOLN
4.0000 mg | Freq: Once | INTRAMUSCULAR | Status: AC
  Administered 2011-05-21: 4 mg via INTRAVENOUS

## 2011-05-21 MED ORDER — ONDANSETRON HCL 4 MG/2ML IJ SOLN
INTRAMUSCULAR | Status: AC
  Filled 2011-05-21: qty 2

## 2011-05-21 MED ORDER — OMEPRAZOLE 20 MG PO CPDR: 20.0000 mg | DELAYED_RELEASE_CAPSULE | Freq: Every day | ORAL | Status: DC

## 2011-05-21 MED ORDER — FENTANYL CITRATE 0.05 MG/ML IJ SOLN
INTRAMUSCULAR | Status: AC
  Filled 2011-05-21: qty 2

## 2011-05-21 MED ORDER — PROPOFOL 10 MG/ML IV EMUL
INTRAVENOUS | Status: AC
  Filled 2011-05-21: qty 20

## 2011-05-21 MED ORDER — MIDAZOLAM HCL 5 MG/5ML IJ SOLN
INTRAMUSCULAR | Status: DC | PRN
  Administered 2011-05-21: 2 mg via INTRAVENOUS

## 2011-05-21 MED ORDER — LACTATED RINGERS IV SOLN
INTRAVENOUS | Status: DC
  Administered 2011-05-21: 07:00:00 via INTRAVENOUS

## 2011-05-21 MED ORDER — BUTAMBEN-TETRACAINE-BENZOCAINE 2-2-14 % EX AERO
1.0000 | INHALATION_SPRAY | Freq: Once | CUTANEOUS | Status: AC
  Administered 2011-05-21: 1 via TOPICAL
  Filled 2011-05-21: qty 56

## 2011-05-21 MED ORDER — FENTANYL CITRATE 0.05 MG/ML IJ SOLN
INTRAMUSCULAR | Status: DC | PRN
  Administered 2011-05-21: 50 ug via INTRAVENOUS

## 2011-05-21 MED ORDER — MINERAL OIL PO OIL
TOPICAL_OIL | ORAL | Status: AC
  Filled 2011-05-21: qty 30

## 2011-05-21 SURGICAL SUPPLY — 17 items
BLOCK BITE 60FR ADLT L/F BLUE (MISCELLANEOUS) ×2 IMPLANT
ELECT REM PT RETURN 9FT ADLT (ELECTROSURGICAL)
ELECTRODE REM PT RTRN 9FT ADLT (ELECTROSURGICAL) IMPLANT
FLOOR PAD 36X40 (MISCELLANEOUS) ×2
FORCEP RJ3 GP 1.8X160 W-NEEDLE (CUTTING FORCEPS) IMPLANT
FORCEPS BIOP RAD 4 LRG CAP 4 (CUTTING FORCEPS) ×1 IMPLANT
NDL SCLEROTHERAPY 25GX240 (NEEDLE) IMPLANT
NEEDLE SCLEROTHERAPY 25GX240 (NEEDLE) IMPLANT
PAD FLOOR 36X40 (MISCELLANEOUS) ×1 IMPLANT
PROBE APC STR FIRE (PROBE) IMPLANT
PROBE INJECTION GOLD (MISCELLANEOUS)
PROBE INJECTION GOLD 7FR (MISCELLANEOUS) IMPLANT
SNARE SHORT THROW 13M SML OVAL (MISCELLANEOUS) IMPLANT
SYR 50ML LL SCALE MARK (SYRINGE) ×1 IMPLANT
TUBING ENDO SMARTCAP PENTAX (MISCELLANEOUS) ×4 IMPLANT
TUBING IRRIGATION ENDOGATOR (MISCELLANEOUS) ×2 IMPLANT
WATER STERILE IRR 1000ML POUR (IV SOLUTION) ×2 IMPLANT
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
## 2011-05-21 NOTE — Telephone Encounter (Signed)
Message copied by Irish Elders on Tue May 21, 2011  9:07 AM ------      Message from: [place]      Created: Tue May 21, 2011  8:31 AM       NEEDS EGD-DIL WITH PROPOFOL ON MAY 30
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
## 2011-05-21 NOTE — Transfer of Care (Signed)
Immediate Anesthesia Transfer of Care Note  Patient: Kelsey Grant  Procedure(s) Performed: Procedure(s) (LRB): ESOPHAGOGASTRODUODENOSCOPY (EGD) WITH ESOPHAGEAL DILATION (N/A)  Patient Location: PACU  Anesthesia Type: MAC  Level of Consciousness: awake, alert  and oriented  Airway & Oxygen Therapy: Patient Spontanous Breathing and Patient connected to face mask oxygen  Post-op Assessment: Report given to PACU RN  Post vital signs: Reviewed and stable  Complications: No apparent anesthesia complications

## 2011-05-21 NOTE — Anesthesia Postprocedure Evaluation (Signed)
  Anesthesia Post-op Note  Patient: Kelsey Grant  Procedure(s) Performed: Procedure(s) (LRB): ESOPHAGOGASTRODUODENOSCOPY (EGD) WITH ESOPHAGEAL DILATION (N/A)  Patient Location: PACU  Anesthesia Type: MAC  Level of Consciousness: awake, alert  and oriented  Airway and Oxygen Therapy: Patient Spontanous Breathing and Patient connected to face mask oxygen  Post-op Pain: none  Post-op Assessment: Post-op Vital signs reviewed, Patient's Cardiovascular Status Stable, Respiratory Function Stable, Patent Airway and No signs of Nausea or vomiting  Post-op Vital Signs: Reviewed and stable  Complications: No apparent anesthesia complications

## 2011-05-21 NOTE — Telephone Encounter (Signed)
Pt added to 05/30 for EGD/ED in the OR- instruction sheet faxed to ENDO and also mailed to pt

## 2011-05-21 NOTE — Discharge Instructions (Signed)
I dilated your esophagus DUE TO YOUR PROBLEM SWALLOWING. You have a SMALL hiatal hernia. You have gastritis. I biopsied your stomach.   INCREASE OMPERAZOLE TO 20 MG 30 MINUTES PRIOR TO MEALS TWICE DAILY. YOU NEED TO TAKE OMPERAZOLE FOREVER. STOP NAPROXEN AND NABUMETONE. RESTART MAY 22. YOU SHOULD TAKE ONE OR THE OTHER WITH FOOD OR MILK. TAKING BOTH CAN CAUSE KIDNEY FAILURE AND BLEEDING FROM YOUR STOMACH.  REPEAT EGD WITH DILATION MAY 30.  FOLLOW A SOFT MECHANICAL LOW FAT DIET. MEATS SHOULD BE CHOPPED OR GROUND ONLY. SEE INFO BELOW. FOLLOW UP IN 4 MOS. UPPER ENDOSCOPY AFTER CARE Read the instructions outlined below and refer to this sheet in the next week. These discharge instructions provide you with general information on caring for yourself after you leave the hospital. While your treatment has been planned according to the most current medical practices available, unavoidable complications occasionally occur. If you have any problems or questions after discharge, call DR. Thad Osoria, [phone].  ACTIVITY  You may resume your regular activity, but move at a slower pace for the next 24 hours.   Take frequent rest periods for the next 24 hours.   Walking will help get rid of the air and reduce the bloated feeling in your belly (abdomen).   No driving for 24 hours (because of the medicine (anesthesia) used during the test).   You may shower.   Do not sign any important legal documents or operate any machinery for 24 hours (because of the anesthesia used during the test).    NUTRITION  Drink plenty of fluids.   You may resume your normal diet as instructed by your doctor.   Begin with a light meal and progress to your normal diet. Heavy or fried foods are harder to digest and may make you feel sick to your stomach (nauseated).   Avoid alcoholic beverages for 24 hours or as instructed.    MEDICATIONS  You may resume your normal medications.   WHAT YOU CAN EXPECT TODAY  Some  feelings of bloating in the abdomen.   Passage of more gas than usual.    IF YOU HAD A BIOPSY TAKEN DURING THE UPPER ENDOSCOPY:  No aspirin products for 14 days.   Eat a soft diet IF YOU HAVE NAUSEA, BLOATING, ABDOMINAL PAIN, OR VOMITING.    FINDING OUT THE RESULTS OF YOUR TEST Not all test results are available during your visit. DR. Darrick Penna WILL CALL YOU WITHIN 7 DAYS OF YOUR PROCEDUE WITH YOUR RESULTS. Do not assume everything is normal if you have not heard from DR. Kamdon Reisig IN ONE WEEK, CALL HER OFFICE AT [phone].  SEEK IMMEDIATE MEDICAL ATTENTION AND CALL THE OFFICE: [phone] IF:  You have more than a spotting of blood in your stool.   Your belly is swollen (abdominal distention).   You are nauseated or vomiting.   You have a temperature over 101F.   You have abdominal pain or discomfort that is severe or gets worse throughout the day.   SOFT MECHANICAL DIET This SOFT MECHANICAL DIET is restricted to:  Foods that are moist, soft-textured, and easy to chew and swallow. MEATS SHOULD BE CHOPPED OR GROUND.  Meats that are ground or are minced no larger than one-quarter inch pieces. Meats are moist with gravy or sauce added.   Foods that do not include bread or bread-like textures except soft pancakes, well-moistened with syrup or sauce.   Textures with some chewing ability required.   Casseroles without rice.  Cooked vegetables that are less than half an inch in size and easily mashed with a fork. No cooked corn, peas, broccoli, cauliflower, cabbage, Brussels sprouts, asparagus, or other fibrous, non-tender or rubbery cooked vegetables.   Canned fruit except for pineapple. Fruit must be cut into pieces no larger than half an inch in size.   Foods that do not include nuts, seeds, coconut, or sticky textures.    FOOD TEXTURES FOR DYSPHAGIA DIET LEVEL 2 -SOFT MECHANICAL DIET (includes all foods on Dysphagia Diet Level 1 - Pureed, in addition to the foods listed  below)  FOOD GROUP: Breads. RECOMMENDED: Soft pancakes, well-moistened with syrup or sauce. AVOID: All others.  FOOD GROUP: Cereals. RECOMMENDED: Cooked cereals with little texture, including oatmeal. Unprocessed wheat bran stirred into cereals for bulk. Note: If thin liquids are restricted, it is important that all of the liquid is absorbed into the cereal. AVOID: All dry cereals and any cooked cereals that may contain flax seeds or other seeds or nuts. Whole-grain, dry, or coarse cereals. Cereals with nuts, seeds, dried fruit, and/or coconut.  FOOD GROUP: Desserts. RECOMMENDED: Pudding, custard. Soft fruit pies with bottom crust only. Canned fruit (excluding pineapple). Soft, moist cakes with icing.Frozen malts, milk shakes, frozen yogurt, eggnog, nutritional supplements, ice cream, sherbet, regular or sugar-free gelatin, or any foods that become thin liquid at either room (70 F) or body temperature (98 F). AVOID: Dry, coarse cakes and cookies. Anything with nuts, seeds, coconut, pineapple, or dried fruit. Breakfast yogurt with nuts. Rice or bread pudding.  FOOD GROUP: Fats. RECOMMENDED: Butter, margarine, cream for cereal (depending on liquid consistency recommendations), gravy, cream sauces, sour cream, sour cream dips with soft additives, mayonnaise, salad dressings, cream cheese, cream cheese spreads with soft additives, whipped toppings. AVOID: All fats with coarse or chunky additives.  FOOD GROUP: Fruits. RECOMMENDED: Soft drained, canned, or cooked fruits without seeds or skin. Fresh soft and ripe banana. Fruit juices with a small amount of pulp. If thin liquids are restricted, fruit juices should be thickened to appropriate consistency. AVOID: Fresh or frozen fruits. Cooked fruit with skin or seeds. Dried fruits. Fresh, canned, or cooked pineapple.  FOOD GROUP: Meats and Meat Substitutes. (Meat pieces should not exceed 1/4 of an inch cube and should be tender.) RECOMMENDED:  Moistened ground or cooked meat, poultry, or fish. Moist ground or tender meat may be served with gravy or sauce. Casseroles without rice. Moist macaroni and cheese, well-cooked pasta with meat sauce, tuna noodle casserole, soft, moist lasagna. Moist meatballs, meatloaf, or fish loaf. Protein salads, such as tuna or egg without large chunks, celery, or onion. Cottage cheese, smooth quiche without large chunks. Poached, scrambled, or soft-cooked eggs (egg yolks should not be "runny" but should be moist and able to be mashed with butter, margarine, or other moisture added to them). (Cook eggs to 160 F or use pasteurized eggs for safety.) Souffls may have small, soft chunks. Tofu. Well-cooked, slightly mashed, moist legumes, such as baked beans. All meats or protein substitutes should be served with sauces or moistened to help maintain cohesiveness in the oral cavity. AVOID: Dry meats, tough meats (such as bacon, sausage, hot dogs, bratwurst). Dry casseroles or casseroles with rice or large chunks. Peanut butter. Cheese slices and cubes. Hard-cooked or crisp fried eggs. Sandwiches.Pizza.  FOOD GROUP: Potatoes and Starches. RECOMMENDED: Well-cooked, moistened, boiled, baked, or mashed potatoes. Well-cooked shredded hash brown potatoes that are not crisp. (All potatoes need to be moist and in sauces.)Well-cooked noodles in sauce.  Spaetzel or soft dumplings that have been moistened with butter or gravy. AVOID: Potato skins and chips. Fried or French-fried potatoes. Rice.  FOOD GROUP: Soups. RECOMMENDED: Soups with easy-to-chew or easy-to-swallow meats or vegetables: Particle sizes in soups should be less than 1/2 inch. Soups will need to be thickened to appropriate consistency if soup is thinner than prescribed liquid consistency. AVOID: Soups with large chunks of meat and vegetables. Soups with rice, corn, peas.  FOOD GROUP: Vegetables. RECOMMENDED: All soft, well-cooked vegetables. Vegetables should be  less than a half inch. Should be easily mashed with a fork. AVOID: Cooked corn and peas. Broccoli, cabbage, Brussels sprouts, asparagus, or other fibrous, non-tender or rubbery cooked vegetables.  FOOD GROUP: Miscellaneous. RECOMMENDED: Jams and preserves without seeds, jelly. Sauces, salsas, etc., that may have small tender chunks less than 1/2 inch. Soft, smooth chocolate bars that are easily chewed. AVOID: Seeds, nuts, coconut, or sticky foods. Chewy candies such as caramels or licorice.   Low-Fat Diet BREADS, CEREALS, PASTA, RICE, DRIED PEAS, AND BEANS These products are high in carbohydrates and most are low in fat. Therefore, they can be increased in the diet as substitutes for fatty foods. They too, however, contain calories and should not be eaten in excess. Cereals can be eaten for snacks as well as for breakfast.   FRUITS AND VEGETABLES It is good to eat fruits and vegetables. Besides being sources of fiber, both are rich in vitamins and some minerals. They help you get the daily allowances of these nutrients. Fruits and vegetables can be used for snacks and desserts.  MEATS Limit lean meat, chicken, [place], and fish to no more than 6 ounces per day. Beef, Pork, and Lamb Use lean cuts of beef, pork, and lamb. Lean cuts include:  Extra-lean ground beef.  Arm roast.  Sirloin tip.  Center-cut ham.  Round steak.  Loin chops.  Rump roast.  Tenderloin.  Trim all fat off the outside of meats before cooking. It is not necessary to severely decrease the intake of red meat, but lean choices should be made. Lean meat is rich in protein and contains a highly absorbable form of iron. Premenopausal women, in particular, should avoid reducing lean red meat because this could increase the risk for low red blood cells (iron-deficiency anemia).  Chicken and [place] These are good sources of protein. The fat of poultry can be reduced by removing the skin and underlying fat layers before cooking.  Chicken and [place] can be substituted for lean red meat in the diet. Poultry should not be fried or covered with high-fat sauces. Fish and Shellfish Fish is a good source of protein. Shellfish contain cholesterol, but they usually are low in saturated fatty acids. The preparation of fish is important. Like chicken and [place], they should not be fried or covered with high-fat sauces. EGGS Egg whites contain no fat or cholesterol. They can be eaten often. Try 1 to 2 egg whites instead of whole eggs in recipes or use egg substitutes that do not contain yolk. MILK AND DAIRY PRODUCTS Use skim or 1% milk instead of 2% or whole milk. Decrease whole milk, natural, and processed cheeses. Use nonfat or low-fat (2%) cottage cheese or low-fat cheeses made from vegetable oils. Choose nonfat or low-fat (1 to 2%) yogurt. Experiment with evaporated skim milk in recipes that call for heavy cream. Substitute low-fat yogurt or low-fat cottage cheese for sour cream in dips and salad dressings. Have at least 2 servings of low-fat dairy products,  such as 2 glasses of skim (or 1%) milk each day to help get your daily calcium intake. FATS AND OILS Reduce the total intake of fats, especially saturated fat. Butterfat, lard, and beef fats are high in saturated fat and cholesterol. These should be avoided as much as possible. Vegetable fats do not contain cholesterol, but certain vegetable fats, such as coconut oil, palm oil, and palm kernel oil are very high in saturated fats. These should be limited. These fats are often used in bakery goods, processed foods, popcorn, oils, and nondairy creamers. Vegetable shortenings and some peanut butters contain hydrogenated oils, which are also saturated fats. Read the labels on these foods and check for saturated vegetable oils. Unsaturated vegetable oils and fats do not raise blood cholesterol. However, they should be limited because they are fats and are high in calories. Total fat should  still be limited to 30% of your daily caloric intake. Desirable liquid vegetable oils are corn oil, cottonseed oil, olive oil, canola oil, safflower oil, soybean oil, and sunflower oil. Peanut oil is not as good, but small amounts are acceptable. Buy a heart-healthy tub margarine that has no partially hydrogenated oils in the ingredients. Mayonnaise and salad dressings often are made from unsaturated fats, but they should also be limited because of their high calorie and fat content. Seeds, nuts, peanut butter, olives, and avocados are high in fat, but the fat is mainly the unsaturated type. These foods should be limited mainly to avoid excess calories and fat. OTHER EATING TIPS Snacks  Most sweets should be limited as snacks. They tend to be rich in calories and fats, and their caloric content outweighs their nutritional value. Some good choices in snacks are graham crackers, melba toast, soda crackers, bagels (no egg), English muffins, fruits, and vegetables. These snacks are preferable to snack crackers, [place] fries, TORTILLA CHIPS, and POTATO chips. Popcorn should be air-popped or cooked in small amounts of liquid vegetable oil. Desserts Eat fruit, low-fat yogurt, and fruit ices instead of pastries, cake, and cookies. Sherbet, angel food cake, gelatin dessert, frozen low-fat yogurt, or other frozen products that do not contain saturated fat (pure fruit juice bars, frozen ice pops) are also acceptable.  COOKING METHODS Choose those methods that use little or no fat. They include: Poaching.  Braising.  Steaming.  Grilling.  Baking.  Stir-frying.  Broiling.  Microwaving.  Foods can be cooked in a nonstick pan without added fat, or use a nonfat cooking spray in regular cookware. Limit fried foods and avoid frying in saturated fat. Add moisture to lean meats by using water, broth, cooking wines, and other nonfat or low-fat sauces along with the cooking methods mentioned above. Soups and stews  should be chilled after cooking. The fat that forms on top after a few hours in the refrigerator should be skimmed off. When preparing meals, avoid using excess salt. Salt can contribute to raising blood pressure in some people.  EATING AWAY FROM HOME Order entres, potatoes, and vegetables without sauces or butter. When meat exceeds the size of a deck of cards (3 to 4 ounces), the rest can be taken home for another meal. Choose vegetable or fruit salads and ask for low-calorie salad dressings to be served on the side. Use dressings sparingly. Limit high-fat toppings, such as bacon, crumbled eggs, cheese, sunflower seeds, and olives. Ask for heart-healthy tub margarine instead of butter.   Gastritis  Gastritis is an inflammation (the body's way of reacting to injury and/or infection) of  the stomach. It is often caused by viral or bacterial (germ) infections. It can also be caused BY ASPIRIN, BC/GOODY POWDER'S, (IBUPROFEN) MOTRIN, OR ALEVE (NAPROXEN), chemicals (including alcohol), SPICY FOODS, and medications. This illness may be associated with generalized malaise (feeling tired, not well), UPPER ABDOMINAL STOMACH cramps, and fever. One common bacterial cause of gastritis is an organism known as H. Pylori. This can be treated with antibiotics.   Hiatal Hernia A hiatal hernia occurs when a part of the stomach slides above the diaphragm. The diaphragm is the thin muscle separating the belly (abdomen) from the chest. A hiatal hernia can be something you are born with or develop over time. Hiatal hernias may allow stomach acid to flow back into your esophagus, the tube which carries food from your mouth to your stomach. If this acid causes problems it is called GERD (gastro-esophageal reflux disease).   SYMPTOMS Common symptoms of GERD are heartburn (burning in your chest). This is worse when lying down or bending over. It may also cause belching and indigestion. Some of the things which make GERD worse  are:  Increased weight pushes on stomach making acid rise more easily.   Smoking markedly increases acid production.   Alcohol decreases lower esophageal sphincter pressure (valve between stomach and esophagus), allowing acid from stomach into esophagus.   Late evening meals and going to bed with a full stomach increases pressure.   Anything that causes an increase in acid production.    HOME CARE INSTRUCTIONS  Try to achieve and maintain an ideal body weight.   Avoid drinking alcoholic beverages.   DO NOT smokE.   Do not wear tight clothing around your chest or stomach.   Eat smaller meals and eat more frequently. This keeps your stomach from getting too full. Eat slowly.   Do not lie down for 2 or 3 hours after eating. Do not eat or drink anything 1 to 2 hours before going to bed.   Avoid caffeine beverages (colas, coffee, cocoa, tea), fatty foods, citrus fruits and all other foods and drinks that contain acid and that seem to increase the problems.   Avoid bending over, especially after eating OR STRAINING. Anything that increases the pressure in your belly increases the amount of acid that may be pushed up into your esophagus.    ESOPHAGEAL STRICTURE  Esophageal strictures can be caused by stomach acid backing up into the tube that carries food from the mouth down to the stomach (lower esophagus).  TREATMENT There are a number of non-prescription medicines used to treat reflux/stricture, including: Antacids.  Proton-pump inhibitors: OMEPRAZOLE   HOME CARE INSTRUCTIONS Eat 2-3 hours before going to bed.  Try to reach and maintain a healthy weight.  Do not eat just a few very large meals. Instead, eat 4 TO 6 smaller meals throughout the day.  Try to identify foods and beverages that make your symptoms worse, and avoid these.  Avoid tight clothing.  Do not exercise right after eating.

## 2011-05-21 NOTE — Op Note (Signed)
[facility] [address] [place], [place]  [address]  ENDOSCOPY PROCEDURE REPORT  PATIENT:  Kelsey Grant, Kelsey Grant  MR#:  [id number] BIRTHDATE:  [birth year], 46 yrs. old  GENDER:  female  ENDOSCOPIST:  Jonette Eva, MD ASSISTANT: Referred by:  Oval Linsey, M.D.  PROCEDURE DATE:  05/21/2011 PROCEDURE:  EGD with dilatation over guidewire, EGD with biopsy ASA CLASS: INDICATIONS:  DYSPHAGIA TO WATER AND SOLID, GERD FAMHx: COLON CA  MEDICATIONS:   MAC sedation, administered by CRNA TOPICAL ANESTHETIC:  Cetacaine Spray  DESCRIPTION OF PROCEDURE:   After the risks benefits and alternatives of the procedure were thoroughly explained, informed consent was obtained.  The  endoscope was introduced through the mouth and advanced to the second portion of the duodenum.  The instrument was slowly withdrawn as the mucosa was carefully examined.  Prior to withdrawal of the scope, the guidwire was placed.  The esophagus was dilated successfully.  The patient was recovered in endoscopy and discharged home in satisfactory condition. <<PROCEDUREIMAGES>>  A stricture was found in the distal esophagus NARROWING LUMEN TO 11 MM.  A 1-2 CM hiatal hernia was found.  Moderate gastritis was found 7 BIOPSIED VIA COLD FORCEPS.    Dilation was then performed at the total esophagus  1) Dilator:  Savary over guidewire  Size(s):  11-14 MM Resistance:  MILD TO moderate  Heme: Appearance:  COMPLICATIONS:  None  ENDOSCOPIC IMPRESSION: 1) Stricture in the distal esophagus 2) Hiatal hernia 3) Moderate gastritis  RECOMMENDATIONS: OMP BID SOFT MECH LOW FAT DIET AWAIT BIOPSIES REPEAT EGD/DIL MAY 30 OPV IN 4 MOS TCS IN NEAR FUTURE  REPEAT EXAM:  No  ______________________________ Jonette Eva, MD  CC:  n. eSIGNED:   Javyn Havlin at 05/21/2011 10:08 AM  Juluis Rainier, [phone]

## 2011-05-28 ENCOUNTER — Telehealth: Payer: Self-pay | Admitting: Gastroenterology

## 2011-05-28 NOTE — Telephone Encounter (Signed)
Called and could not leave a message, VM full.

## 2011-05-28 NOTE — Telephone Encounter (Signed)
Results Cc to PCP & reminder is in the computer 

## 2011-05-28 NOTE — Telephone Encounter (Signed)
Please call pt. Kelsey Grant stomach Bx shows gastritis FROM NABUMETONE AND NAPROXEN.    CONTINUE OMPERAZOLE TO 20 MG 30 MINUTES PRIOR TO MEALS TWICE DAILY. YOU NEED TO TAKE OMPERAZOLE FOREVER. STOP NAPROXEN AND NABUMETONE. RESTART MAY 22. YOU SHOULD TAKE ONE OR THE OTHER WITH FOOD OR MILK. TAKING BOTH CAN CAUSE KIDNEY FAILURE AND BLEEDING FROM YOUR STOMACH. REPEAT EGD WITH DILATION MAY 30. FOLLOW A SOFT MECHANICAL LOW FAT DIET. MEATS SHOULD BE CHOPPED OR GROUND ONLY.   FOLLOW UP IN 4 MOS.

## 2011-05-29 ENCOUNTER — Ambulatory Visit (INDEPENDENT_AMBULATORY_CARE_PROVIDER_SITE_OTHER): Payer: Medicare Other | Admitting: Gastroenterology

## 2011-05-29 ENCOUNTER — Encounter: Payer: Self-pay | Admitting: Gastroenterology

## 2011-05-29 DIAGNOSIS — R1310 Dysphagia, unspecified: Secondary | ICD-10-CM

## 2011-05-29 DIAGNOSIS — Z800 Family history of malignant neoplasm of digestive organs: Secondary | ICD-10-CM

## 2011-05-29 NOTE — Telephone Encounter (Signed)
Pt was in for OV with Dr. Darrick Penna today and informed by her. Pt is scheduled procedures on 06/06/2011.

## 2011-05-30 ENCOUNTER — Encounter (HOSPITAL_COMMUNITY): Payer: Self-pay

## 2011-05-30 ENCOUNTER — Encounter (HOSPITAL_COMMUNITY)
Admission: RE | Admit: 2011-05-30 | Discharge: 2011-05-30 | Disposition: A | Discharge status: Home / Self Care | Payer: Medicare Other | Source: Ambulatory Visit | Attending: Gastroenterology | Admitting: Gastroenterology

## 2011-05-30 DIAGNOSIS — Z01812 Encounter for preprocedural laboratory examination: Secondary | ICD-10-CM | POA: Insufficient documentation

## 2011-06-05 ENCOUNTER — Telehealth: Payer: Self-pay | Admitting: Gastroenterology
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
## 2011-06-05 NOTE — Telephone Encounter (Signed)
Pt called to cancel her procedure and would like to [facility].  Please call her back @ [phone]

## 2011-06-05 NOTE — Assessment & Plan Note (Signed)
DYSPHAGIA IMPROVED.  COMPLETE EGD/DIL 5/30. OPV IN 4 MOS.

## 2011-06-05 NOTE — Telephone Encounter (Signed)
Tried to call pt and R/S - no answer- LMOVM

## 2011-06-05 NOTE — Assessment & Plan Note (Addendum)
FATHER HAD COLON CA AGE < 60.  TCS MAY 30 AND EVERY 5 YEARS. ENDOSCOPY WITH MAC DUE TO FAILED CONSCIOUS SEDATION IN THE PAST. HIGH FIBER DIET.

## 2011-06-06 ENCOUNTER — Encounter (HOSPITAL_COMMUNITY)
Admission: RE | Disposition: A | Discharge status: Home / Self Care | Payer: Self-pay | Source: Ambulatory Visit | Attending: Gastroenterology

## 2011-06-06 SURGERY — ESOPHAGOGASTRODUODENOSCOPY (EGD) WITH PROPOFOL
Anesthesia: Monitor Anesthesia Care

## 2011-06-08 HISTORY — PX: COLONOSCOPY: SHX174

## 2011-06-08 HISTORY — PX: ESOPHAGOGASTRODUODENOSCOPY: SHX1529

## 2011-06-11 ENCOUNTER — Encounter (HOSPITAL_COMMUNITY): Payer: Self-pay | Admitting: Pharmacy Technician

## 2011-06-13 ENCOUNTER — Encounter (HOSPITAL_COMMUNITY): Payer: Self-pay

## 2011-06-13 ENCOUNTER — Encounter (HOSPITAL_COMMUNITY)
Admission: RE | Admit: 2011-06-13 | Discharge: 2011-06-13 | Disposition: A | Discharge status: Home / Self Care | Payer: Medicare Other | Source: Ambulatory Visit | Attending: Gastroenterology | Admitting: Gastroenterology

## 2011-06-13 NOTE — Patient Instructions (Signed)
[address]  06/13/2011   Your procedure is scheduled on:  06/18/11  Report to [facility] at 06:15 AM.  Call this number if you have problems the morning of surgery: [phone]   Remember:   Do not eat food:After Midnight.  May have clear liquids:until Midnight .  Clear liquids include soda, tea, black coffee, apple or grape juice, broth.  Take these medicines the morning of surgery with A SIP OF WATER: Prilosec. Take your Percocet only if needed.   Do not wear jewelry, make-up or nail polish.  Do not wear lotions, powders, or perfumes. You may wear deodorant.  Do not bring valuables to the hospital.  Contacts, dentures or bridgework may not be worn into surgery.   Patients discharged the day of surgery will not be allowed to drive home.   Please read over the following fact sheets that you were given: Anesthesia Post-op Instructions and Care and Recovery After Surgery    Esophagogastroduodenoscopy This is an endoscopic procedure (a procedure that uses a device like a flexible telescope) that allows your caregiver to view the upper stomach and small bowel. This test allows your caregiver to look at the esophagus. The esophagus carries food from your mouth to your stomach. They can also look at your duodenum. This is the first part of the small intestine that attaches to the stomach. This test is used to detect problems in the bowel such as ulcers and inflammation. PREPARATION FOR TEST Nothing to eat after midnight the day before the test. NORMAL FINDINGS Normal esophagus, stomach, and duodenum. Ranges for normal findings may vary among different laboratories and hospitals. You should always check with your doctor after having lab work or other tests done to discuss the meaning of your test results and whether your values are considered within normal limits. MEANING OF TEST  Your caregiver will go over the test results with you and discuss the importance and meaning of your  results, as well as treatment options and the need for additional tests if necessary. OBTAINING THE TEST RESULTS It is your responsibility to obtain your test results. Ask the lab or department performing the test when and how you will get your results. Document Released: 04/26/2004 Document Revised: 12/13/2010 Document Reviewed: 12/04/2007 [facility] Patient Information 2012 [place], [place].   Esophageal Dilatation The esophagus is the long, narrow tube which carries food and liquid from the mouth to the stomach. Esophageal dilatation is the technique used to stretch a blocked or narrowed portion of the esophagus. This procedure is used when a part of the esophagus has become so narrow that it becomes difficult, painful or even impossible to swallow. This is generally an uncomplicated form of treatment. When this is not successful, chest surgery may be required. This is a much more extensive form of treatment with a longer recovery time. CAUSES  Some of the more common causes of blockage or strictures of the esophagus are:  Narrowing from longstanding inflammation (soreness and redness) of the lower esophagus. This comes from the constant exposure of the lower esophagus to the acid which bubbles up from the stomach. Over time this causes scarring and narrowing of the lower esophagus.   Hiatal hernia in which a small part of the stomach bulges (herniates) up through the diaphragm. This can cause a gradual narrowing of the end of the esophagus.   Schatzki's Ring is a narrow ring of benign (non-cancerous) fibrous tissue which constricts the lower esophagus. The reason for this is  not known.   Scleroderma is a connective tissue disorder that affects the esophagus and makes swallowing difficult.   Achalasia is an absence of nerves to the lower esophagus and to the esophageal sphincter. This is the circular muscle between the stomach and esophagus that relaxes to allow food into the stomach. After  swallowing, it contracts to keep food in the stomach. This absence of nerves may be congenital (present since birth). This can cause irregular spasms of the lower esophageal muscle. This spasm does not open up to allow food and fluid through. The result is a persistent blockage with subsequent slow trickling of the esophageal contents into the stomach.   Strictures may develop from swallowing materials which damage the esophagus. Some examples are strong acids or alkalis such as lye.   Growths such as benign (non-cancerous) and malignant (cancerous) tumors can block the esophagus.   Heredity (present since birth) causes.  DIAGNOSIS  Your caregiver often suspects this problem by taking a medical history. They will also do a physical exam. They can then prove their suspicions using X-rays and endoscopy. Endoscopy is an exam in which a tube like a small flexible telescope is used to look at your esophagus.  TREATMENT There are different stretching (dilating) techniques which can be used. Simple bougie dilatation may be done in the office. This usually takes only a couple minutes. A numbing (anesthetic) spray of the throat is used. Endoscopy, when done, is done in an endoscopy suite, under mild sedation. When fluoroscopy is used, the procedure is performed in X-ray. Other techniques require a little longer time. Recovery is usually quick. There is no waiting time to begin eating and drinking to test success of the treatment. Following are some of the methods used. Narrowing of the esophagus is treated by making it bigger. Commonly this is a mechanical problem which can be treated with stretching. This can be done in different ways. Your caregiver will discuss these with you. Some of the means used are:  A series of graduated (increasing thickness) flexible dilators can be used. These are weighted tubes passed through the esophagus into the stomach. The tubes used become progressively larger until the  desired stretched size is reached. Graduated dilators are a simple and quick way of opening the esophagus. No visualization is required.   Another method is the use of endoscopy to place a flexible wire across the stricture. The endoscope is removed and the wire left in place. A dilator with a hole through it from end to end is guided down the esophagus and across the stricture. One or more of these dilators are passed over the wire. At the end of the exam, the wire is removed. This type of treatment may be performed in the X-ray department under fluoroscopy. An advantage of this procedure is the examiner is visualizing the end opening in the esophagus.   Stretching of the esophagus may be done using balloons. Deflated balloons are placed through the endoscope and across the stricture. This type of balloon dilatation is often done at the time of endoscopy or fluoroscopy. Flexible endoscopy allows the examiner to directly view the stricture. A balloon is inserted in the deflated form into the area of narrowing. It is then inflated with air to a certain pressure that is pre-set for a given circumference. When inflated, it becomes sausage shaped, stretched, and makes the stricture larger.   Achalasia requires a longer larger balloon-type dilator. This is frequently done under X-ray control. In this  situation, the spastic muscle fibers in the lower esophagus are stretched.  All of the above procedures make the passage of food and water into the stomach easier. They also make it easier for stomach contents to reflux back into the esophagus. Special medications may be used following the procedure to help prevent further stricturing. Proton-pump inhibitor medications are good at decreasing the amount of acid in the stomach juice. When stomach juice refluxes into the esophagus, the juice is no longer as acidic and is less likely to burn or scar the esophagus. RISKS AND COMPLICATIONS Esophageal dilatation is usually  performed effectively and without problems. Some complications that can occur are:  A small amount of bleeding almost always happens where the stretching takes place. If this is too excessive it may require more aggressive treatment.   An uncommon complication is perforation (making a hole) of the esophagus. The esophagus is thin. It is easy to make a hole in it. If this happens, an operation may be necessary to repair this.   A small, undetected perforation could lead to an infection in the chest. This can be very serious.  HOME CARE INSTRUCTIONS   If you received sedation for your procedure, do not drive, make important decisions, or perform any activities requiring your full coordination. Do not drink alcohol, take sedatives, or use any mind altering chemicals unless instructed by your caregiver.   You may use throat lozenges or warm salt water gargles if you have throat discomfort   You can begin eating and drinking normally on return home unless instructed otherwise. Do not purposely try to force large chunks of food down to test the benefits of your procedure.   Mild discomfort can be eased with sips of ice water.   Medications for discomfort may or may not be needed.  SEEK IMMEDIATE MEDICAL CARE IF:   You begin vomiting up blood.   You develop black tarry stools   You develop chills or an unexplained temperature of over 101 F (38.3 C)   You develop chest or abdominal pain.   You develop shortness of breath or feel lightheaded or faint.   Your swallowing is becoming more painful, difficult, or you are unable to swallow.  MAKE SURE YOU:   Understand these instructions.   Will watch your condition.   Will get help right away if you are not doing well or get worse.  Document Released: 02/14/2005 Document Revised: 12/13/2010 Document Reviewed: 04/03/2005 [facility] Patient Information 2012 [place], [place].   Colonoscopy A colonoscopy is an exam to evaluate your entire  colon. In this exam, your colon is cleansed. A long fiberoptic tube is inserted through your rectum and into your colon. The fiberoptic scope (endoscope) is a long bundle of enclosed and very flexible fibers. These fibers transmit light to the area examined and send images from that area to your caregiver. Discomfort is usually minimal. You may be given a drug to help you sleep (sedative) during or prior to the procedure. This exam helps to detect lumps (tumors), polyps, inflammation, and areas of bleeding. Your caregiver may also take a small piece of tissue (biopsy) that will be examined under a microscope. LET YOUR CAREGIVER KNOW ABOUT:   Allergies to food or medicine.   Medicines taken, including vitamins, herbs, eyedrops, over-the-counter medicines, and creams.   Use of steroids (by mouth or creams).   Previous problems with anesthetics or numbing medicines.   History of bleeding problems or blood clots.   Previous surgery.  Other health problems, including diabetes and kidney problems.   Possibility of pregnancy, if this applies.  BEFORE THE PROCEDURE   A clear liquid diet may be required for 2 days before the exam.   Ask your caregiver about changing or stopping your regular medications.   Liquid injections (enemas) or laxatives may be required.   A large amount of electrolyte solution may be given to you to drink over a short period of time. This solution is used to clean out your colon.   You should be present 60 minutes prior to your procedure or as directed by your caregiver.  AFTER THE PROCEDURE   If you received a sedative or pain relieving medication, you will need to arrange for someone to drive you home.   Occasionally, there is a little blood passed with the first bowel movement. Do not be concerned.  FINDING OUT THE RESULTS OF YOUR TEST Not all test results are available during your visit. If your test results are not back during the visit, make an appointment  with your caregiver to find out the results. Do not assume everything is normal if you have not heard from your caregiver or the medical facility. It is important for you to follow up on all of your test results. HOME CARE INSTRUCTIONS   It is not unusual to pass moderate amounts of gas and experience mild abdominal cramping following the procedure. This is due to air being used to inflate your colon during the exam. Walking or a warm pack on your belly (abdomen) may help.   You may resume all normal meals and activities after sedatives and medicines have worn off.   Only take over-the-counter or prescription medicines for pain, discomfort, or fever as directed by your caregiver. Do not use aspirin or blood thinners if a biopsy was taken. Consult your caregiver for medicine usage if biopsies were taken.  SEEK IMMEDIATE MEDICAL CARE IF:   You have a fever.   You pass large blood clots or fill a toilet with blood following the procedure. This may also occur 10 to 14 days following the procedure. This is more likely if a biopsy was taken.   You develop abdominal pain that keeps getting worse and cannot be relieved with medicine.  Document Released: 12/22/1999 Document Revised: 12/13/2010 Document Reviewed: 08/06/2007 [facility] Patient Information 2012 [place], [place].   PATIENT INSTRUCTIONS POST-ANESTHESIA  IMMEDIATELY FOLLOWING SURGERY:  Do not drive or operate machinery for the first twenty four hours after surgery.  Do not make any important decisions for twenty four hours after surgery or while taking narcotic pain medications or sedatives.  If you develop intractable nausea and vomiting or a severe headache please notify your doctor immediately.  FOLLOW-UP:  Please make an appointment with your surgeon as instructed. You do not need to follow up with anesthesia unless specifically instructed to do so.  WOUND CARE INSTRUCTIONS (if applicable):  Keep a dry clean dressing on the  anesthesia/puncture wound site if there is drainage.  Once the wound has quit draining you may leave it open to air.  Generally you should leave the bandage intact for twenty four hours unless there is drainage.  If the epidural site drains for more than 36-48 hours please call the anesthesia department.  QUESTIONS?:  Please feel free to call your physician or the hospital operator if you have any questions, and they will be happy to assist you.

## 2011-06-18 ENCOUNTER — Encounter (HOSPITAL_COMMUNITY): Payer: Self-pay | Admitting: Anesthesiology

## 2011-06-18 ENCOUNTER — Encounter (HOSPITAL_COMMUNITY)
Admission: RE | Disposition: A | Discharge status: Home / Self Care | Payer: Self-pay | Source: Ambulatory Visit | Attending: Gastroenterology

## 2011-06-18 ENCOUNTER — Ambulatory Visit (HOSPITAL_COMMUNITY)
Admission: RE | Admit: 2011-06-18 | Discharge: 2011-06-18 | Disposition: A | Discharge status: Home / Self Care | Payer: Medicare Other | Source: Ambulatory Visit | Attending: Gastroenterology | Admitting: Gastroenterology

## 2011-06-18 ENCOUNTER — Ambulatory Visit (HOSPITAL_COMMUNITY): Payer: Medicare Other | Admitting: Anesthesiology

## 2011-06-18 ENCOUNTER — Encounter (HOSPITAL_COMMUNITY): Payer: Self-pay

## 2011-06-18 DIAGNOSIS — K222 Esophageal obstruction: Secondary | ICD-10-CM | POA: Insufficient documentation

## 2011-06-18 DIAGNOSIS — K648 Other hemorrhoids: Secondary | ICD-10-CM | POA: Insufficient documentation

## 2011-06-18 DIAGNOSIS — Z01812 Encounter for preprocedural laboratory examination: Secondary | ICD-10-CM | POA: Insufficient documentation

## 2011-06-18 DIAGNOSIS — K2940 Chronic atrophic gastritis without bleeding: Secondary | ICD-10-CM | POA: Insufficient documentation

## 2011-06-18 DIAGNOSIS — Z1211 Encounter for screening for malignant neoplasm of colon: Secondary | ICD-10-CM

## 2011-06-18 DIAGNOSIS — K449 Diaphragmatic hernia without obstruction or gangrene: Secondary | ICD-10-CM | POA: Insufficient documentation

## 2011-06-18 DIAGNOSIS — Z79899 Other long term (current) drug therapy: Secondary | ICD-10-CM | POA: Insufficient documentation

## 2011-06-18 DIAGNOSIS — K2990 Gastroduodenitis, unspecified, without bleeding: Secondary | ICD-10-CM

## 2011-06-18 DIAGNOSIS — K5730 Diverticulosis of large intestine without perforation or abscess without bleeding: Secondary | ICD-10-CM

## 2011-06-18 DIAGNOSIS — K2970 Gastritis, unspecified, without bleeding: Secondary | ICD-10-CM

## 2011-06-18 DIAGNOSIS — R1310 Dysphagia, unspecified: Secondary | ICD-10-CM

## 2011-06-18 DIAGNOSIS — E7800 Pure hypercholesterolemia, unspecified: Secondary | ICD-10-CM | POA: Insufficient documentation

## 2011-06-18 HISTORY — PX: SAVORY DILATION: SHX5439

## 2011-06-18 SURGERY — COLONOSCOPY WITH PROPOFOL
Anesthesia: Monitor Anesthesia Care

## 2011-06-18 MED ORDER — PROPOFOL 10 MG/ML IV EMUL
INTRAVENOUS | Status: DC | PRN
  Administered 2011-06-18: 08:00:00 via INTRAVENOUS
  Administered 2011-06-18: 100 ug/kg/min via INTRAVENOUS

## 2011-06-18 MED ORDER — ONDANSETRON HCL 4 MG/2ML IJ SOLN: 4.0000 mg | Freq: Once | INTRAMUSCULAR | Status: DC | PRN

## 2011-06-18 MED ORDER — PROPOFOL 10 MG/ML IV EMUL
INTRAVENOUS | Status: AC
  Filled 2011-06-18: qty 20

## 2011-06-18 MED ORDER — FENTANYL CITRATE 0.05 MG/ML IJ SOLN: 25.0000 ug | INTRAMUSCULAR | Status: DC | PRN

## 2011-06-18 MED ORDER — PROPOFOL 10 MG/ML IV EMUL
INTRAVENOUS | Status: DC | PRN
  Administered 2011-06-18: 7.6 mg via INTRAVENOUS

## 2011-06-18 MED ORDER — LACTATED RINGERS IV SOLN
INTRAVENOUS | Status: DC
  Administered 2011-06-18: 07:00:00 via INTRAVENOUS

## 2011-06-18 MED ORDER — GLYCOPYRROLATE 0.2 MG/ML IJ SOLN
INTRAMUSCULAR | Status: AC
  Administered 2011-06-18: 0.2 mg via INTRAVENOUS
  Filled 2011-06-18: qty 1

## 2011-06-18 MED ORDER — ONDANSETRON HCL 4 MG/2ML IJ SOLN
INTRAMUSCULAR | Status: AC
  Administered 2011-06-18: 4 mg via INTRAVENOUS
  Filled 2011-06-18: qty 2

## 2011-06-18 MED ORDER — FENTANYL CITRATE 0.05 MG/ML IJ SOLN
INTRAMUSCULAR | Status: DC | PRN
  Administered 2011-06-18 (×2): 25 ug via INTRAVENOUS
  Administered 2011-06-18: 50 ug via INTRAVENOUS

## 2011-06-18 MED ORDER — GLYCOPYRROLATE 0.2 MG/ML IJ SOLN
0.2000 mg | Freq: Once | INTRAMUSCULAR | Status: AC
  Administered 2011-06-18: 0.2 mg via INTRAVENOUS

## 2011-06-18 MED ORDER — SIMETHICONE 40 MG/0.6ML PO SUSP
ORAL | Status: DC | PRN
  Administered 2011-06-18: 07:00:00

## 2011-06-18 MED ORDER — MIDAZOLAM HCL 2 MG/2ML IJ SOLN
INTRAMUSCULAR | Status: AC
  Administered 2011-06-18: 2 mg via INTRAVENOUS
  Filled 2011-06-18: qty 2

## 2011-06-18 MED ORDER — WATER FOR IRRIGATION, STERILE IR SOLN
Status: DC | PRN
  Administered 2011-06-18: 1000 mL

## 2011-06-18 MED ORDER — FENTANYL CITRATE 0.05 MG/ML IJ SOLN
INTRAMUSCULAR | Status: AC
  Filled 2011-06-18: qty 2

## 2011-06-18 MED ORDER — MIDAZOLAM HCL 2 MG/2ML IJ SOLN
1.0000 mg | INTRAMUSCULAR | Status: DC | PRN
  Administered 2011-06-18: 2 mg via INTRAVENOUS

## 2011-06-18 MED ORDER — LIDOCAINE HCL (PF) 1 % IJ SOLN
INTRAMUSCULAR | Status: AC
  Filled 2011-06-18: qty 5

## 2011-06-18 MED ORDER — ONDANSETRON HCL 4 MG/2ML IJ SOLN
4.0000 mg | Freq: Once | INTRAMUSCULAR | Status: AC
  Administered 2011-06-18: 4 mg via INTRAVENOUS

## 2011-06-18 MED ORDER — LIDOCAINE HCL (CARDIAC) 10 MG/ML IV SOLN
INTRAVENOUS | Status: DC | PRN
  Administered 2011-06-18: 10 mg via INTRAVENOUS

## 2011-06-18 MED ORDER — BUTAMBEN-TETRACAINE-BENZOCAINE 2-2-14 % EX AERO
1.0000 | INHALATION_SPRAY | Freq: Once | CUTANEOUS | Status: AC
  Administered 2011-06-18: 1 via TOPICAL
  Filled 2011-06-18: qty 56

## 2011-06-18 MED ORDER — MINERAL OIL PO OIL
TOPICAL_OIL | ORAL | Status: AC
  Filled 2011-06-18: qty 30

## 2011-06-18 SURGICAL SUPPLY — 25 items
BLOCK BITE 60FR ADLT L/F BLUE (MISCELLANEOUS) ×2 IMPLANT
ELECT REM PT RETURN 9FT ADLT (ELECTROSURGICAL)
ELECTRODE REM PT RTRN 9FT ADLT (ELECTROSURGICAL) IMPLANT
FCP BXJMBJMB 240X2.8X (CUTTING FORCEPS)
FLOOR PAD 36X40 (MISCELLANEOUS) ×2
FORCEP RJ3 GP 1.8X160 W-NEEDLE (CUTTING FORCEPS) IMPLANT
FORCEPS BIOP RAD 4 LRG CAP 4 (CUTTING FORCEPS) IMPLANT
FORCEPS BIOP RJ4 240 W/NDL (CUTTING FORCEPS)
FORCEPS BXJMBJMB 240X2.8X (CUTTING FORCEPS) IMPLANT
INJECTOR/SNARE I SNARE (MISCELLANEOUS) IMPLANT
LUBRICANT JELLY 4.5OZ STERILE (MISCELLANEOUS) ×1 IMPLANT
MANIFOLD NEPTUNE II (INSTRUMENTS) ×2 IMPLANT
NDL SCLEROTHERAPY 25GX240 (NEEDLE) IMPLANT
NEEDLE SCLEROTHERAPY 25GX240 (NEEDLE) IMPLANT
PAD FLOOR 36X40 (MISCELLANEOUS) ×1 IMPLANT
PROBE APC STR FIRE (PROBE) IMPLANT
PROBE INJECTION GOLD (MISCELLANEOUS)
PROBE INJECTION GOLD 7FR (MISCELLANEOUS) IMPLANT
SNARE ROTATE MED OVAL 20MM (MISCELLANEOUS) IMPLANT
SNARE SHORT THROW 13M SML OVAL (MISCELLANEOUS) ×1 IMPLANT
SYR 50ML LL SCALE MARK (SYRINGE) ×1 IMPLANT
TRAP SPECIMEN MUCOUS 40CC (MISCELLANEOUS) IMPLANT
TUBING ENDO SMARTCAP PENTAX (MISCELLANEOUS) ×2 IMPLANT
TUBING IRRIGATION ENDOGATOR (MISCELLANEOUS) ×2 IMPLANT
WATER STERILE IRR 1000ML POUR (IV SOLUTION) ×2 IMPLANT

## 2011-06-18 NOTE — Anesthesia Postprocedure Evaluation (Signed)
Anesthesia Post Note  Patient: Kelsey Grant  Procedure(s) Performed: Procedure(s) (LRB): COLONOSCOPY WITH PROPOFOL (N/A) ESOPHAGOGASTRODUODENOSCOPY (EGD) WITH PROPOFOL (N/A) SAVORY DILATION (N/A)  Anesthesia type: MAC  Patient location: PACU  Post pain: Pain level controlled  Post assessment: Post-op Vital signs reviewed, Patient's Cardiovascular Status Stable, Respiratory Function Stable, Patent Airway, No signs of Nausea or vomiting and Pain level controlled  Last Vitals:  Filed Vitals:   06/18/11 0846  BP: 109/75  Pulse: 83  Temp: 36.7 C  Resp: 18    Post vital signs: Reviewed and stable  Level of consciousness: awake and alert   Complications: No apparent anesthesia complications
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
## 2011-06-18 NOTE — Transfer of Care (Signed)
Immediate Anesthesia Transfer of Care Note  Patient: Kelsey Grant  Procedure(s) Performed: Procedure(s) (LRB): COLONOSCOPY WITH PROPOFOL (N/A) ESOPHAGOGASTRODUODENOSCOPY (EGD) WITH PROPOFOL (N/A) SAVORY DILATION (N/A)  Patient Location: PACU  Anesthesia Type: MAC  Level of Consciousness: awake  Airway & Oxygen Therapy: Patient Spontanous Breathing. Nasal cannula  Post-op Assessment: Report given to PACU RN, Post -op Vital signs reviewed and stable and Patient moving all extremities  Post vital signs: Reviewed and stable  Complications: No apparent anesthesia complications

## 2011-06-18 NOTE — Anesthesia Procedure Notes (Signed)
Procedure Name: MAC Date/Time: 06/18/2011 7:53 AM Performed by: Franco Nones Pre-anesthesia Checklist: Patient identified, Emergency Drugs available, Suction available, Timeout performed and Patient being monitored Patient Re-evaluated:Patient Re-evaluated prior to inductionOxygen Delivery Method: Non-rebreather mask
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
## 2011-06-18 NOTE — Discharge Instructions (Signed)
You have hemorrhoids and diverticulosis in your left colon. You have A NARROWING AT THE BASE OF YOUR ESOPHAGUS MOST LIKELY DUE TO REFLUX. YOUR UPPER ENDOSCOPY SHOWED A HIATAL HERNIA AND GASTRITIS.   CONTINUE OMEPRAZOLE.   AVOID ITEMS THAT TRIGGER GASTRITIS or reflux. .  FOLLOW A HIGH FIBER/LOW FAT DIET. AVOID ITEMS THAT CAUSE BLOATING. SEE INFO BELOW.  FOLLOW UP IN 4 MOS WITH DR. Phaedra Colgate.  REPEAT COLONOSCOPY IN 5 YEARS.   ENDOSCOPY Care After Read the instructions outlined below and refer to this sheet in the next week. These discharge instructions provide you with general information on caring for yourself after you leave the hospital. While your treatment has been planned according to the most current medical practices available, unavoidable complications occasionally occur. If you have any problems or questions after discharge, call DR. Keysi Oelkers, [phone].  ACTIVITY  You may resume your regular activity, but move at a slower pace for the next 24 hours.   Take frequent rest periods for the next 24 hours.   Walking will help get rid of the air and reduce the bloated feeling in your belly (abdomen).   No driving for 24 hours (because of the medicine (anesthesia) used during the test).   You may shower.   Do not sign any important legal documents or operate any machinery for 24 hours (because of the anesthesia used during the test).    NUTRITION  Drink plenty of fluids.   You may resume your normal diet as instructed by your doctor.   Begin with a light meal and progress to your normal diet. Heavy or fried foods are harder to digest and may make you feel sick to your stomach (nauseated).   Avoid alcoholic beverages for 24 hours or as instructed.    MEDICATIONS  You may resume your normal medications.   WHAT YOU CAN EXPECT TODAY  Some feelings of bloating in the abdomen.   Passage of more gas than usual.   Spotting of blood in your stool or on the toilet  paper  .  IF YOU HAD POLYPS REMOVED DURING THE ENDOSCOPY:  Eat a soft diet IF YOU HAVE NAUSEA, BLOATING, ABDOMINAL PAIN, OR VOMITING.    FINDING OUT THE RESULTS OF YOUR TEST Not all test results are available during your visit. DR. Darrick Penna WILL CALL YOU WITHIN 7 DAYS OF YOUR PROCEDUE WITH YOUR RESULTS. Do not assume everything is normal if you have not heard from DR. Aundreya Souffrant IN ONE WEEK, CALL HER OFFICE AT [phone].  SEEK IMMEDIATE MEDICAL ATTENTION AND CALL THE OFFICE: [phone] IF:  You have more than a spotting of blood in your stool.   Your belly is swollen (abdominal distention).   You are nauseated or vomiting.   You have a temperature over 101F.   You have abdominal pain or discomfort that is severe or gets worse throughout the day.   Gastritis  Gastritis is an inflammation (the body's way of reacting to injury and/or infection) of the stomach. It is often caused by bacterial (germ) infections. It can also be caused BY ASPIRIN, BC/GOODY POWDER'S, (IBUPROFEN) MOTRIN, OR ALEVE (NAPROXEN), chemicals (including alcohol), SPICY FOODS, and medications. This illness may be associated with generalized malaise (feeling tired, not well), UPPER ABDOMINAL STOMACH cramps, and fever. One common bacterial cause of gastritis is an organism known as H. Pylori. This can be treated with antibiotics.     Hiatal Hernia A hiatal hernia occurs when a part of the stomach slides above the diaphragm.  The diaphragm is the thin muscle separating the belly (abdomen) from the chest. A hiatal hernia can be something you are born with or develop over time. Hiatal hernias may allow stomach acid to flow back into your esophagus, the tube which carries food from your mouth to your stomach. If this acid causes problems it is called GERD (gastro-esophageal reflux disease).   SYMPTOMS Common symptoms of GERD are heartburn (burning in your chest). This is worse when lying down or bending over. It may also  cause belching and indigestion. Some of the things which make GERD worse are:  Increased weight pushes on stomach making acid rise more easily.   Smoking markedly increases acid production.   Alcohol decreases lower esophageal sphincter pressure (valve between stomach and esophagus), allowing acid from stomach into esophagus.   Late evening meals and going to bed with a full stomach increases pressure.   Anything that causes an increase in acid production.    HOME CARE INSTRUCTIONS  Try to achieve and maintain an ideal body weight.   Avoid drinking alcoholic beverages.   DO NOT smokE.   Do not wear tight clothing around your chest or stomach.   Eat smaller meals and eat more frequently. This keeps your stomach from getting too full. Eat slowly.   Do not lie down for 2 or 3 hours after eating. Do not eat or drink anything 1 to 2 hours before going to bed.   Avoid caffeine beverages (colas, coffee, cocoa, tea), fatty foods, citrus fruits and all other foods and drinks that contain acid and that seem to increase the problems.   Avoid bending over, especially after eating OR STRAINING. Anything that increases the pressure in your belly increases the amount of acid that may be pushed up into your esophagus.    High-Fiber Diet A high-fiber diet changes your normal diet to include more whole grains, legumes, fruits, and vegetables. Changes in the diet involve replacing refined carbohydrates with unrefined foods. The calorie level of the diet is essentially unchanged. The Dietary Reference Intake (recommended amount) for adult males is 38 grams per day. For adult females, it is 25 grams per day. Pregnant and lactating women should consume 28 grams of fiber per day. Fiber is the intact part of a plant that is not broken down during digestion. Functional fiber is fiber that has been isolated from the plant to provide a beneficial effect in the body. PURPOSE  Increase stool bulk.   Ease  and regulate bowel movements.   Lower cholesterol.  INDICATIONS THAT YOU NEED MORE FIBER  Constipation and hemorrhoids.   Uncomplicated diverticulosis (intestine condition) and irritable bowel syndrome.   Weight management.   As a protective measure against hardening of the arteries (atherosclerosis), diabetes, and cancer.   GUIDELINES FOR INCREASING FIBER IN THE DIET  Start adding fiber to the diet slowly. A gradual increase of about 5 more grams (2 slices of whole-wheat bread, 2 servings of most fruits or vegetables, or 1 bowl of high-fiber cereal) per day is best. Too rapid an increase in fiber may result in constipation, flatulence, and bloating.   Drink enough water and fluids to keep your urine clear or pale yellow. Water, juice, or caffeine-free drinks are recommended. Not drinking enough fluid may cause constipation.   Eat a variety of high-fiber foods rather than one type of fiber.   Try to increase your intake of fiber through using high-fiber foods rather than fiber pills or supplements that contain small  amounts of fiber.   The goal is to change the types of food eaten. Do not supplement your present diet with high-fiber foods, but replace foods in your present diet.  INCLUDE A VARIETY OF FIBER SOURCES  Replace refined and processed grains with whole grains, canned fruits with fresh fruits, and incorporate other fiber sources. White rice, white breads, and most bakery goods contain little or no fiber.   Brown whole-grain rice, buckwheat oats, and many fruits and vegetables are all good sources of fiber. These include: broccoli, Brussels sprouts, cabbage, cauliflower, beets, sweet potatoes, white potatoes (skin on), carrots, tomatoes, eggplant, squash, berries, fresh fruits, and dried fruits.   Cereals appear to be the richest source of fiber. Cereal fiber is found in whole grains and bran. Bran is the fiber-rich outer coat of cereal grain, which is largely removed in  refining. In whole-grain cereals, the bran remains. In breakfast cereals, the largest amount of fiber is found in those with "bran" in their names. The fiber content is sometimes indicated on the label.   You may need to include additional fruits and vegetables each day.   In baking, for 1 cup white flour, you may use the following substitutions:   1 cup whole-wheat flour minus 2 tablespoons.   1/2 cup white flour plus 1/2 cup whole-wheat flour.   Low-Fat Diet BREADS, CEREALS, PASTA, RICE, DRIED PEAS, AND BEANS These products are high in carbohydrates and most are low in fat. Therefore, they can be increased in the diet as substitutes for fatty foods. They too, however, contain calories and should not be eaten in excess. Cereals can be eaten for snacks as well as for breakfast.  Include foods that contain fiber (fruits, vegetables, whole grains, and legumes). Research shows that fiber may lower blood cholesterol levels, especially the water-soluble fiber found in fruits, vegetables, oat products, and legumes. FRUITS AND VEGETABLES It is good to eat fruits and vegetables. Besides being sources of fiber, both are rich in vitamins and some minerals. They help you get the daily allowances of these nutrients. Fruits and vegetables can be used for snacks and desserts. MEATS Limit lean meat, chicken, [place], and fish to no more than 6 ounces per day. Beef, Pork, and Lamb Use lean cuts of beef, pork, and lamb. Lean cuts include:  Extra-lean ground beef.  Arm roast.  Sirloin tip.  Center-cut ham.  Round steak.  Loin chops.  Rump roast.  Tenderloin.  Trim all fat off the outside of meats before cooking. It is not necessary to severely decrease the intake of red meat, but lean choices should be made. Lean meat is rich in protein and contains a highly absorbable form of iron. Premenopausal women, in particular, should avoid reducing lean red meat because this could increase the risk for low red blood  cells (iron-deficiency anemia). The organ meats, such as liver, sweetbreads, kidneys, and brain are very rich in cholesterol. They should be limited. Chicken and [place] These are good sources of protein. The fat of poultry can be reduced by removing the skin and underlying fat layers before cooking. Chicken and [place] can be substituted for lean red meat in the diet. Poultry should not be fried or covered with high-fat sauces. Fish and Shellfish Fish is a good source of protein. Shellfish contain cholesterol, but they usually are low in saturated fatty acids. The preparation of fish is important. Like chicken and [place], they should not be fried or covered with high-fat sauces. EGGS Egg whites  contain no fat or cholesterol. They can be eaten often. Try 1 to 2 egg whites instead of whole eggs in recipes or use egg substitutes that do not contain yolk. MILK AND DAIRY PRODUCTS Use skim or 1% milk instead of 2% or whole milk. Decrease whole milk, natural, and processed cheeses. Use nonfat or low-fat (2%) cottage cheese or low-fat cheeses made from vegetable oils. Choose nonfat or low-fat (1 to 2%) yogurt. Experiment with evaporated skim milk in recipes that call for heavy cream. Substitute low-fat yogurt or low-fat cottage cheese for sour cream in dips and salad dressings. Have at least 2 servings of low-fat dairy products, such as 2 glasses of skim (or 1%) milk each day to help get your daily calcium intake.  FATS AND OILS Reduce the total intake of fats, especially saturated fat. Butterfat, lard, and beef fats are high in saturated fat and cholesterol. These should be avoided as much as possible. Vegetable fats do not contain cholesterol, but certain vegetable fats, such as coconut oil, palm oil, and palm kernel oil are very high in saturated fats. These should be limited. These fats are often used in bakery goods, processed foods, popcorn, oils, and nondairy creamers. Vegetable shortenings and some peanut  butters contain hydrogenated oils, which are also saturated fats. Read the labels on these foods and check for saturated vegetable oils. Unsaturated vegetable oils and fats do not raise blood cholesterol. However, they should be limited because they are fats and are high in calories. Total fat should still be limited to 30% of your daily caloric intake. Desirable liquid vegetable oils are corn oil, cottonseed oil, olive oil, canola oil, safflower oil, soybean oil, and sunflower oil. Peanut oil is not as good, but small amounts are acceptable. Buy a heart-healthy tub margarine that has no partially hydrogenated oils in the ingredients. Mayonnaise and salad dressings often are made from unsaturated fats, but they should also be limited because of their high calorie and fat content. Seeds, nuts, peanut butter, olives, and avocados are high in fat, but the fat is mainly the unsaturated type. These foods should be limited mainly to avoid excess calories and fat. OTHER EATING TIPS Snacks  Most sweets should be limited as snacks. They tend to be rich in calories and fats, and their caloric content outweighs their nutritional value. Some good choices in snacks are graham crackers, melba toast, soda crackers, bagels (no egg), English muffins, fruits, and vegetables. These snacks are preferable to snack crackers, [place] fries, and chips. Popcorn should be air-popped or cooked in small amounts of liquid vegetable oil. Desserts Eat fruit, low-fat yogurt, and fruit ices. AVOID pastries, cake, and cookies. Sherbet, angel food cake, gelatin dessert, frozen low-fat yogurt, or other frozen products that do not contain saturated fat (pure fruit juice bars, frozen ice pops) are also acceptable.  COOKING METHODS Choose those methods that use little or no fat. They include: Poaching.  Braising.  Steaming.  Grilling.  Baking.  Stir-frying.  Broiling.  Microwaving.  Foods can be cooked in a nonstick pan without added  fat, or use a nonfat cooking spray in regular cookware. Limit fried foods and avoid frying in saturated fat. Add moisture to lean meats by using water, broth, cooking wines, and other nonfat or low-fat sauces along with the cooking methods mentioned above. Soups and stews should be chilled after cooking. The fat that forms on top after a few hours in the refrigerator should be skimmed off. When preparing meals, avoid using  excess salt. Salt can contribute to raising blood pressure in some people. EATING AWAY FROM HOME Order entres, potatoes, and vegetables without sauces or butter. When meat exceeds the size of a deck of cards (3 to 4 ounces), the rest can be taken home for another meal. Choose vegetable or fruit salads and ask for low-calorie salad dressings to be served on the side. Use dressings sparingly. Limit high-fat toppings, such as bacon, crumbled eggs, cheese, sunflower seeds, and olives. Ask for heart-healthy tub margarine instead of butter.  Diverticulosis Diverticulosis is a common condition that develops when small pouches (diverticula) form in the wall of the colon. The risk of diverticulosis increases with age. It happens more often in people who eat a low-fiber diet. Most individuals with diverticulosis have no symptoms. Those individuals with symptoms usually experience belly (abdominal) pain, constipation, or loose stools (diarrhea).  HOME CARE INSTRUCTIONS  Increase the amount of fiber in your diet as directed by your caregiver or dietician. This may reduce symptoms of diverticulosis.   Drink at least 6 to 8 glasses of water each day to prevent constipation.   Try not to strain when you have a bowel movement.   Avoiding nuts and seeds to prevent complications is still an uncertain benefit.       FOODS HAVING HIGH FIBER CONTENT INCLUDE:  Fruits. Apple, peach, pear, tangerine, raisins, prunes.   Vegetables. Brussels sprouts, asparagus, broccoli, cabbage, carrot,  cauliflower, romaine lettuce, spinach, summer squash, tomato, winter squash, zucchini.   Starchy Vegetables. Baked beans, kidney beans, lima beans, split peas, lentils, potatoes (with skin).   Grains. Whole wheat bread, brown rice, bran flake cereal, plain oatmeal, white rice, shredded wheat, bran muffins.    SEEK IMMEDIATE MEDICAL CARE IF:  You develop increasing pain or severe bloating.   You have an oral temperature above 101F.   You develop vomiting or bowel movements that are bloody or black.    Hemorrhoids Hemorrhoids are dilated (enlarged) veins around the rectum. Sometimes clots will form in the veins. This makes them swollen and painful. These are called thrombosed hemorrhoids. Causes of hemorrhoids include:  Constipation.   Straining to have a bowel movement.   HEAVY LIFTING HOME CARE INSTRUCTIONS  Eat a well balanced diet and drink 6 to 8 glasses of water every day to avoid constipation. You may also use a bulk laxative.   Avoid straining to have bowel movements.   Keep anal area dry and clean.   Do not use a donut shaped pillow or sit on the toilet for long periods. This increases blood pooling and pain.   Move your bowels when your body has the urge; this will require less straining and will decrease pain and pressure.

## 2011-06-18 NOTE — Interval H&P Note (Signed)
History and Physical Interval Note:  06/18/2011 7:41 AM  Kelsey Grant  has presented today for surgery, with the diagnosis of dysphagia and high risk screening CRC  The various methods of treatment have been discussed with the patient and family. After consideration of risks, benefits and other options for treatment, the patient has consented to  Procedure(s) (LRB): COLONOSCOPY WITH PROPOFOL (N/A) ESOPHAGOGASTRODUODENOSCOPY (EGD) WITH PROPOFOL (N/A) SAVORY DILATION (N/A) as a surgical intervention .  The patients' history has been reviewed, patient examined, no change in status, stable for surgery.  I have reviewed the patients' chart and labs.  Questions were answered to the patient's satisfaction.     [employer]
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
## 2011-06-18 NOTE — Op Note (Signed)
[facility] [address] [place], [place]  [address]  DR. FIELD'S EGDdTCS PROCEDURE REPORT  PATIENT:  Kelsey Grant, Kelsey Grant  MR#:  [id number] BIRTHDATE:  [birth year], 46 yrs. old  GENDER:  female  ENDOSCOPIST:  Jonette Eva, MD ASSISTANT: Referred by:  Oval Linsey, M.D.  PROCEDURE DATE:  06/18/2011 PROCEDURE:  EGD with dilatation over guidewire ASA CLASS: INDICATIONS:  DYSPHAGIA/SCREENING  MEDICATIONS:   MAC sedation, administered by CRNA TOPICAL ANESTHETIC:  Cetacaine Spray  DESCRIPTION OF PROCEDURE:   After the risks benefits and alternatives of the procedure were thoroughly explained, informed consent was obtained.  The  endoscope was introduced through the mouth and advanced to the second portion of the duodenum.  The instrument was slowly withdrawn as the mucosa was carefully examined.  Prior to withdrawal of the scope, the guidwire was placed.  The esophagus was dilated successfully.  After administration of sedation and rectal exam, the patient's rectum was intubated and the  colonoscope was advanced under direct visualization to the cecum.  The scope was removed slowly by carefully examining the color, texture, anatomy, and integrity mucosa on the way out.  The patient was recovered in endoscopy and discharged home in satisfactory condition.  The patient was recovered in endoscopy and discharged home in satisfactory condition.  <<PROCEDUREIMAGES>>  Mild gastritis was found.  A 2-3 CM hiatal hernia was found.  A stricture was found in the distal esophagus.    Dilation was then performed at the total esophagus. FREQUENT DIVERTICULA IN THE DESCENDING/SIGMOID COLON. SMALL INTERNAL HEMORRHOIDS  1) Dilator:  Savary over guidewire  Size(s):  14-17 MM Resistance:  minimal(14 MM)-MODERATE  Heme:  yes Appearance:  (15-17 MM)  COMPLICATIONS:  None  ENDOSCOPIC IMPRESSION: 1) Mild gastritis 2) Hiatal hernia 3) Stricture in the distal esophagus MILD  DIVERTICULOSIS SMALL INTERNAL HEMORRHOIDS RECOMMENDATIONS: OMEPRAZOLE BID FOR 3 MOS THEN DAILY HIGH FIBER/LOW FAT DIET OPV IN 4 MOS TCS IN 5 YEARS WITH PROPOFOL  REPEAT EXAM:  No  ______________________________ Jonette Eva, MD  CC:  n. eSIGNED:   Kynlea Blackston at 06/18/2011 09:58 AM  Juluis Rainier, [phone]

## 2011-06-20 ENCOUNTER — Encounter (HOSPITAL_COMMUNITY): Payer: Self-pay | Admitting: Gastroenterology

## 2011-10-15 ENCOUNTER — Encounter: Payer: Self-pay | Admitting: Gastroenterology

## 2011-10-16 ENCOUNTER — Ambulatory Visit (INDEPENDENT_AMBULATORY_CARE_PROVIDER_SITE_OTHER): Payer: Medicare Other | Admitting: Gastroenterology

## 2011-10-16 ENCOUNTER — Encounter: Payer: Self-pay | Admitting: Gastroenterology

## 2011-10-16 VITALS — BP 119/71 | HR 76 | Temp 98.4°F | Ht 62.0 in | Wt 172.4 lb

## 2011-10-16 DIAGNOSIS — K219 Gastro-esophageal reflux disease without esophagitis: Secondary | ICD-10-CM | POA: Insufficient documentation

## 2011-10-16 DIAGNOSIS — Z1211 Encounter for screening for malignant neoplasm of colon: Secondary | ICD-10-CM | POA: Insufficient documentation

## 2011-10-16 DIAGNOSIS — R1310 Dysphagia, unspecified: Secondary | ICD-10-CM

## 2011-10-16 MED ORDER — OMEPRAZOLE 20 MG PO CPDR: DELAYED_RELEASE_CAPSULE | ORAL | Status: DC

## 2011-10-16 NOTE — Assessment & Plan Note (Signed)
NEXT TCS 2018 

## 2011-10-16 NOTE — Assessment & Plan Note (Addendum)
Controlled in 1-2x/day Prilosec.  LOSE 20 LBS. LOW FAT DIET REFILL PRILOSEC FOR 1 YEAR.CONTINUE FOREVER. DISCUSSED NEED TO CONTROL REFLUX & LIFE STYLE FACTORS TO HELP CONTROL SYMPTOMS. OPV IN 1 YEAR

## 2011-10-16 NOTE — Assessment & Plan Note (Signed)
Improved
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
## 2011-10-16 NOTE — Progress Notes (Signed)
Faxed to PCP

## 2011-10-16 NOTE — Patient Instructions (Addendum)
CONTINUE PRILOSEC.  LOSE 20 LBS.  FOLLOW A LOW FAT DIET. SEE INFO BELOW.  FOLLOW UP IN 1 YEAR.  Low-Fat Diet BREADS, CEREALS, PASTA, RICE, DRIED PEAS, AND BEANS These products are high in carbohydrates and most are low in fat. Therefore, they can be increased in the diet as substitutes for fatty foods. They too, however, contain calories and should not be eaten in excess. Cereals can be eaten for snacks as well as for breakfast.   FRUITS AND VEGETABLES It is good to eat fruits and vegetables. Besides being sources of fiber, both are rich in vitamins and some minerals. They help you get the daily allowances of these nutrients. Fruits and vegetables can be used for snacks and desserts.  MEATS Limit lean meat, chicken, [place], and fish to no more than 6 ounces per day. Beef, Pork, and Lamb Use lean cuts of beef, pork, and lamb. Lean cuts include:  Extra-lean ground beef.  Arm roast.  Sirloin tip.  Center-cut ham.  Round steak.  Loin chops.  Rump roast.  Tenderloin.  Trim all fat off the outside of meats before cooking. It is not necessary to severely decrease the intake of red meat, but lean choices should be made. Lean meat is rich in protein and contains a highly absorbable form of iron. Premenopausal women, in particular, should avoid reducing lean red meat because this could increase the risk for low red blood cells (iron-deficiency anemia).  Chicken and [place] These are good sources of protein. The fat of poultry can be reduced by removing the skin and underlying fat layers before cooking. Chicken and [place] can be substituted for lean red meat in the diet. Poultry should not be fried or covered with high-fat sauces. Fish and Shellfish Fish is a good source of protein. Shellfish contain cholesterol, but they usually are low in saturated fatty acids. The preparation of fish is important. Like chicken and [place], they should not be fried or covered with high-fat sauces. EGGS Egg  whites contain no fat or cholesterol. They can be eaten often. Try 1 to 2 egg whites instead of whole eggs in recipes or use egg substitutes that do not contain yolk. MILK AND DAIRY PRODUCTS Use skim or 1% milk instead of 2% or whole milk. Decrease whole milk, natural, and processed cheeses. Use nonfat or low-fat (2%) cottage cheese or low-fat cheeses made from vegetable oils. Choose nonfat or low-fat (1 to 2%) yogurt. Experiment with evaporated skim milk in recipes that call for heavy cream. Substitute low-fat yogurt or low-fat cottage cheese for sour cream in dips and salad dressings. Have at least 2 servings of low-fat dairy products, such as 2 glasses of skim (or 1%) milk each day to help get your daily calcium intake. FATS AND OILS Reduce the total intake of fats, especially saturated fat. Butterfat, lard, and beef fats are high in saturated fat and cholesterol. These should be avoided as much as possible. Vegetable fats do not contain cholesterol, but certain vegetable fats, such as coconut oil, palm oil, and palm kernel oil are very high in saturated fats. These should be limited. These fats are often used in bakery goods, processed foods, popcorn, oils, and nondairy creamers. Vegetable shortenings and some peanut butters contain hydrogenated oils, which are also saturated fats. Read the labels on these foods and check for saturated vegetable oils. Unsaturated vegetable oils and fats do not raise blood cholesterol. However, they should be limited because they are fats and are high in calories.  Total fat should still be limited to 30% of your daily caloric intake. Desirable liquid vegetable oils are corn oil, cottonseed oil, olive oil, canola oil, safflower oil, soybean oil, and sunflower oil. Peanut oil is not as good, but small amounts are acceptable. Buy a heart-healthy tub margarine that has no partially hydrogenated oils in the ingredients. Mayonnaise and salad dressings often are made from  unsaturated fats, but they should also be limited because of their high calorie and fat content. Seeds, nuts, peanut butter, olives, and avocados are high in fat, but the fat is mainly the unsaturated type. These foods should be limited mainly to avoid excess calories and fat. OTHER EATING TIPS Snacks  Most sweets should be limited as snacks. They tend to be rich in calories and fats, and their caloric content outweighs their nutritional value. Some good choices in snacks are graham crackers, melba toast, soda crackers, bagels (no egg), English muffins, fruits, and vegetables. These snacks are preferable to snack crackers, [place] fries, TORTILLA CHIPS, and POTATO chips. Popcorn should be air-popped or cooked in small amounts of liquid vegetable oil. Desserts Eat fruit, low-fat yogurt, and fruit ices instead of pastries, cake, and cookies. Sherbet, angel food cake, gelatin dessert, frozen low-fat yogurt, or other frozen products that do not contain saturated fat (pure fruit juice bars, frozen ice pops) are also acceptable.  COOKING METHODS Choose those methods that use little or no fat. They include: Poaching.  Braising.  Steaming.  Grilling.  Baking.  Stir-frying.  Broiling.  Microwaving.  Foods can be cooked in a nonstick pan without added fat, or use a nonfat cooking spray in regular cookware. Limit fried foods and avoid frying in saturated fat. Add moisture to lean meats by using water, broth, cooking wines, and other nonfat or low-fat sauces along with the cooking methods mentioned above. Soups and stews should be chilled after cooking. The fat that forms on top after a few hours in the refrigerator should be skimmed off. When preparing meals, avoid using excess salt. Salt can contribute to raising blood pressure in some people.  EATING AWAY FROM HOME Order entres, potatoes, and vegetables without sauces or butter. When meat exceeds the size of a deck of cards (3 to 4 ounces), the rest can  be taken home for another meal. Choose vegetable or fruit salads and ask for low-calorie salad dressings to be served on the side. Use dressings sparingly. Limit high-fat toppings, such as bacon, crumbled eggs, cheese, sunflower seeds, and olives. Ask for heart-healthy tub margarine instead of butter.

## 2011-10-17 NOTE — Progress Notes (Signed)
Reminder in epic to follow up in one year with SF °

## 2012-07-13 ENCOUNTER — Ambulatory Visit (INDEPENDENT_AMBULATORY_CARE_PROVIDER_SITE_OTHER): Payer: Medicare Other | Admitting: Obstetrics & Gynecology

## 2012-07-13 ENCOUNTER — Encounter: Payer: Self-pay | Admitting: Obstetrics & Gynecology

## 2012-07-13 VITALS — BP 110/70 | Ht 63.0 in | Wt 160.0 lb

## 2012-07-13 DIAGNOSIS — Z01419 Encounter for gynecological examination (general) (routine) without abnormal findings: Secondary | ICD-10-CM

## 2012-07-13 DIAGNOSIS — Z0000 Encounter for general adult medical examination without abnormal findings: Secondary | ICD-10-CM

## 2012-07-13 NOTE — Progress Notes (Signed)
Patient ID: Kelsey Grant, female   DOB: 1965-06-24, 47 y.o.   MRN: [id number] Subjective:     Kelsey Grant is a 47 y.o. female here for a routine exam.  Patient's last menstrual period was 06/23/2012. No obstetric history on file. Current complaints: none.  Personal health questionnaire reviewed: yes.   Gynecologic History Patient's last menstrual period was 06/23/2012. Contraception: tubal ligation Last Pap: 2013. Results were: normal Last mammogram: 2013 . Results were: na  Obstetric History OB History   Grav Para Term Preterm Abortions TAB SAB Ect Mult Living                   The following portions of the patient's history were reviewed and updated as appropriate: allergies, current medications, past family history, past medical history, past social history, past surgical history and problem list.  Review of Systems  Review of Systems  Constitutional: Negative for fever, chills, weight loss, malaise/fatigue and diaphoresis.  HENT: Negative for hearing loss, ear pain, nosebleeds, congestion, sore throat, neck pain, tinnitus and ear discharge.   Eyes: Negative for blurred vision, double vision, photophobia, pain, discharge and redness.  Respiratory: Negative for cough, hemoptysis, sputum production, shortness of breath, wheezing and stridor.   Cardiovascular: Negative for chest pain, palpitations, orthopnea, claudication, leg swelling and PND.  Gastrointestinal: negative for abdominal pain. Negative for heartburn, nausea, vomiting, diarrhea, constipation, blood in stool and melena.  Genitourinary: Negative for dysuria, urgency, frequency, hematuria and flank pain.  Musculoskeletal: Negative for myalgias, back pain, joint pain and falls.  Skin: Negative for itching and rash.  Neurological: Negative for dizziness, tingling, tremors, sensory change, speech change, focal weakness, seizures, loss of consciousness, weakness and headaches.  Endo/Heme/Allergies: Negative for  environmental allergies and polydipsia. Does not bruise/bleed easily.  Psychiatric/Behavioral: Negative for depression, suicidal ideas, hallucinations, memory loss and substance abuse. The patient is not nervous/anxious and does not have insomnia.        Objective:    Physical Exam  Vitals reviewed. Constitutional: She is oriented to person, place, and time. She appears well-developed and well-nourished.  HENT:  Head: Normocephalic and atraumatic.        Right Ear: External ear normal.  Left Ear: External ear normal.  Nose: Nose normal.  Mouth/Throat: Oropharynx is clear and moist.  Eyes: Conjunctivae and EOM are normal. Pupils are equal, round, and reactive to light. Right eye exhibits no discharge. Left eye exhibits no discharge. No scleral icterus.  Neck: Normal range of motion. Neck supple. No tracheal deviation present. No thyromegaly present.  Cardiovascular: Normal rate, regular rhythm, normal heart sounds and intact distal pulses.  Exam reveals no gallop and no friction rub.   No murmur heard. Respiratory: Effort normal and breath sounds normal. No respiratory distress. She has no wheezes. She has no rales. She exhibits no tenderness.  GI: Soft. Bowel sounds are normal. She exhibits no distension and no mass. There is no tenderness. There is no rebound and no guarding.  Genitourinary:       Vulva is normal without lesions Vagina is pink moist without discharge Cervix normal in appearance and pap is done Uterus is normal size shape and contour Adnexa is negative with normal sized ovaries   Musculoskeletal: Normal range of motion. She exhibits no edema and no tenderness.  Neurological: She is alert and oriented to person, place, and time. She has normal reflexes. She displays normal reflexes. No cranial nerve deficit. She exhibits normal muscle tone. Coordination normal.  Skin:  Skin is warm and dry. No rash noted. No erythema. No pallor.  Psychiatric: She has a normal mood and  affect. Her behavior is normal. Judgment and thought content normal.       Assessment:    Healthy female exam.    Plan:    Mammogram ordered. Follow up in: 1 year.

## 2012-07-13 NOTE — Patient Instructions (Addendum)
Mammogram [id number]    Mammography Mammography is an X-ray of the breasts to look for changes that are not normal. The X-ray image is called a mammogram. This procedure can screen for breast cancer, can detect cancer early, and can diagnose cancer.  LET YOUR CAREGIVER KNOW ABOUT:  Breast implants.  Previous breast disease, biopsy, or surgery.  If you are breastfeeding.  Medicines taken, including vitamins, herbs, eyedrops, over-the-counter medicines, and creams.  Use of steroids (by mouth or creams).  Possibility of pregnancy, if this applies. RISKS AND COMPLICATIONS  Exposure to radiation, but at very low levels.  The results may be misinterpreted.  The results may not be accurate.  Mammography may lead to further tests.  Mammography may not catch certain cancers. BEFORE THE PROCEDURE  Schedule your test about 7 days after your menstrual period. This is when your breasts are the least tender and have signs of hormone changes.  If you have had a mammography done at a different facility in the past, get the mammogram X-rays or have them sent to your current exam facility in order to compare them.  Wash your breasts and under your arms the day of the test.  Do not wear deodorants, perfumes, or powders anywhere on your body.  Wear clothes that you can change in and out of easily. PROCEDURE Relax as much as possible during the test. Any discomfort during the test will be very brief. The test should take less than 30 minutes. The following will happen:  You will undress from the waist up and put on a gown.  You will stand in front of the X-ray machine.  Each breast will be placed between 2 plastic or glass plates. The plates will compress your breast for a few seconds.  X-rays will be taken from different angles of the breast. AFTER THE PROCEDURE  The mammogram will be examined.  Depending on the quality of the images, you may need to repeat certain parts of the  test.  Ask when your test results will be ready. Make sure you get your test results.  You may resume normal activities. Document Released: 12/22/1999 Document Revised: 03/18/2011 Document Reviewed: 10/14/2010 [facility] Patient Information 2014 [place], [place].

## 2012-10-06 ENCOUNTER — Encounter: Payer: Self-pay | Admitting: Gastroenterology

## 2012-10-19 ENCOUNTER — Other Ambulatory Visit: Payer: Self-pay | Admitting: Gastroenterology

## 2012-12-14 ENCOUNTER — Other Ambulatory Visit (HOSPITAL_COMMUNITY): Payer: Self-pay | Admitting: Family Medicine

## 2012-12-14 DIAGNOSIS — Z139 Encounter for screening, unspecified: Secondary | ICD-10-CM

## 2012-12-18 ENCOUNTER — Ambulatory Visit (HOSPITAL_COMMUNITY): Payer: Medicaid Other

## 2012-12-22 ENCOUNTER — Ambulatory Visit (HOSPITAL_COMMUNITY)
Admission: RE | Admit: 2012-12-22 | Discharge: 2012-12-22 | Disposition: A | Discharge status: Home / Self Care | Payer: Medicare Other | Source: Ambulatory Visit | Attending: Family Medicine | Admitting: Family Medicine

## 2012-12-22 DIAGNOSIS — Z1231 Encounter for screening mammogram for malignant neoplasm of breast: Secondary | ICD-10-CM | POA: Insufficient documentation

## 2012-12-22 DIAGNOSIS — Z139 Encounter for screening, unspecified: Secondary | ICD-10-CM

## 2013-06-23 ENCOUNTER — Encounter: Payer: Medicare Other | Admitting: Gastroenterology

## 2013-06-23 ENCOUNTER — Telehealth: Payer: Self-pay | Admitting: Gastroenterology

## 2013-06-23 NOTE — Telephone Encounter (Signed)
REVIEWED.  

## 2013-06-23 NOTE — Telephone Encounter (Signed)
Pt was a no show
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
## 2013-07-19 ENCOUNTER — Ambulatory Visit (INDEPENDENT_AMBULATORY_CARE_PROVIDER_SITE_OTHER): Payer: Medicare Other | Admitting: Obstetrics & Gynecology

## 2013-07-19 ENCOUNTER — Other Ambulatory Visit (HOSPITAL_COMMUNITY)
Admission: RE | Admit: 2013-07-19 | Discharge: 2013-07-19 | Disposition: A | Discharge status: Home / Self Care | Payer: Medicare Other | Source: Ambulatory Visit | Attending: Obstetrics & Gynecology | Admitting: Obstetrics & Gynecology

## 2013-07-19 ENCOUNTER — Encounter: Payer: Self-pay | Admitting: Obstetrics & Gynecology

## 2013-07-19 VITALS — BP 120/80 | Ht 63.4 in | Wt 166.4 lb

## 2013-07-19 DIAGNOSIS — Z1151 Encounter for screening for human papillomavirus (HPV): Secondary | ICD-10-CM | POA: Diagnosis present

## 2013-07-19 DIAGNOSIS — R87810 Cervical high risk human papillomavirus (HPV) DNA test positive: Secondary | ICD-10-CM | POA: Diagnosis present

## 2013-07-19 DIAGNOSIS — Z01419 Encounter for gynecological examination (general) (routine) without abnormal findings: Secondary | ICD-10-CM | POA: Insufficient documentation

## 2013-07-19 DIAGNOSIS — Z124 Encounter for screening for malignant neoplasm of cervix: Secondary | ICD-10-CM
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
## 2013-07-21 LAB — CYTOLOGY - PAP

## 2013-07-22 ENCOUNTER — Other Ambulatory Visit: Payer: Self-pay | Admitting: Gastroenterology

## 2013-07-23 NOTE — Telephone Encounter (Signed)
Refills provided through October. Needs to be seen before then by Dr. fields to continue prescriptions

## 2013-07-29 ENCOUNTER — Encounter: Payer: Self-pay | Admitting: Gastroenterology

## 2013-07-29 NOTE — Telephone Encounter (Signed)
Mailed letter °

## 2013-09-02 ENCOUNTER — Encounter: Payer: Self-pay | Admitting: Gastroenterology

## 2013-09-02 ENCOUNTER — Encounter (INDEPENDENT_AMBULATORY_CARE_PROVIDER_SITE_OTHER): Payer: Self-pay

## 2013-09-02 ENCOUNTER — Ambulatory Visit (INDEPENDENT_AMBULATORY_CARE_PROVIDER_SITE_OTHER): Payer: Medicare Other | Admitting: Gastroenterology

## 2013-09-02 VITALS — BP 132/82 | HR 70 | Temp 97.2°F | Ht 62.0 in | Wt 161.4 lb

## 2013-09-02 DIAGNOSIS — K219 Gastro-esophageal reflux disease without esophagitis: Secondary | ICD-10-CM

## 2013-09-02 DIAGNOSIS — R1310 Dysphagia, unspecified: Secondary | ICD-10-CM

## 2013-09-02 NOTE — Assessment & Plan Note (Addendum)
Sx NOT IDEALLY CONTROLLED.  LOW FAT DIET CONTINUE YOUR WEIGHT LOSS EFFORTS. AVOID GERD TRIGGERS: CAFFEINE, NICOTINE. CONTINUE OMEPRAZOLE.  TAKE 30 MINUTES PRIOR TO YOUR MEALS TWICE DAILY. FOLLOW UP IN 6 MOS.

## 2013-09-02 NOTE — Patient Instructions (Signed)
CONTINUE YOUR WEIGHT LOSS EFFORTS.  FOLLOW A LOW FAT DIET. SEE INFO BELOW.  CONTINUE OMEPRAZOLE.  TAKE 30 MINUTES PRIOR TO YOUR MEALS TWICE DAILY.  Avoid triggers for reflux. See info below.  FOLLOW UP IN 6 MOS. MERRY CHRISTMAS AND HAPPY NEW YEAR!    Lifestyle and home remedies TO HELP CONTROL HEARTBURN.  You may eliminate or reduce the frequency of heartburn by making the following lifestyle changes:    Control your weight. Being overweight is a major risk factor for heartburn and GERD. Excess pounds put pressure on your abdomen, pushing up your stomach and causing acid to back up into your esophagus.     Eat smaller meals. 4 TO 6 MEALS A DAY. This reduces pressure on the lower esophageal sphincter, helping to prevent the valve from opening and acid from washing back into your esophagus.     Loosen your belt. Clothes that fit tightly around your waist put pressure on your abdomen and the lower esophageal sphincter.       Eliminate heartburn triggers. Everyone has specific triggers.      Common triggers such as fatty or fried foods, spicy food, tomato sauce, carbonated beverages, alcohol, chocolate, mint, garlic, onion, caffeine and nicotine may make heartburn worse.     Avoid stooping or bending. Tying your shoes is OK. Bending over for longer periods to weed your garden isn't, especially soon after eating.     Don't lie down after a meal. Wait at least three to four hours after eating before going to bed, and don't lie down right after eating.     PLACE THE HEAD OF YOUR BED ON 6 INCH BLOCKS.  Alternative medicine   Several home remedies exist for treating GERD, but they provide only temporary relief. They include drinking baking soda (sodium bicarbonate) added to water or drinking other fluids such as baking soda mixed with cream of tartar and water.   Although these liquids create temporary relief by neutralizing, washing away or buffering acids, eventually they aggravate the  situation by adding gas and fluid to your stomach, increasing pressure and causing more acid reflux. Further, adding more sodium to your diet may increase your blood pressure and add stress to your heart, and excessive bicarbonate ingestion can alter the acid-base balance in your body.

## 2013-09-02 NOTE — Assessment & Plan Note (Signed)
Sx controlled.  CONTINUE TO MONITOR SYMPTOMS. opv 6 mos
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
## 2013-09-08 NOTE — Progress Notes (Signed)
Cc to pcp °

## 2013-10-11 ENCOUNTER — Other Ambulatory Visit (HOSPITAL_COMMUNITY): Payer: Self-pay | Admitting: Family Medicine

## 2013-10-11 ENCOUNTER — Ambulatory Visit (HOSPITAL_COMMUNITY)
Admission: RE | Admit: 2013-10-11 | Discharge: 2013-10-11 | Disposition: A | Discharge status: Home / Self Care | Payer: Medicare Other | Source: Ambulatory Visit | Attending: Family Medicine | Admitting: Family Medicine

## 2013-10-11 DIAGNOSIS — G8929 Other chronic pain: Secondary | ICD-10-CM

## 2013-10-11 DIAGNOSIS — M25552 Pain in left hip: Principal | ICD-10-CM

## 2013-11-06 ENCOUNTER — Other Ambulatory Visit: Payer: Self-pay | Admitting: Gastroenterology

## 2013-11-26 IMAGING — CR DG CERVICAL SPINE COMPLETE 4+V
5 series · 5 of 5 positions shown · non-contrast
Comparison: 07/16/2010

CLINICAL DATA: Pain, left-sided

CERVICAL SPINE - COMPLETE 4+ VIEW

[view not recorded (1 of 5)]
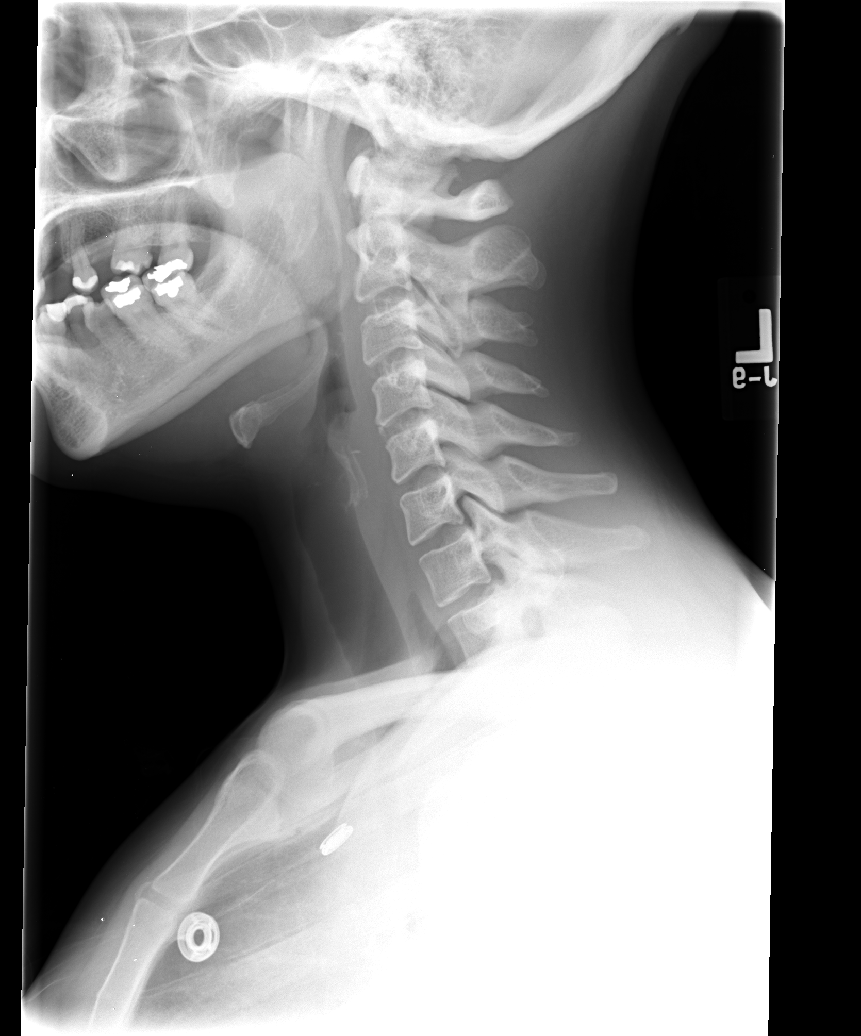

[view not recorded (2 of 5)]
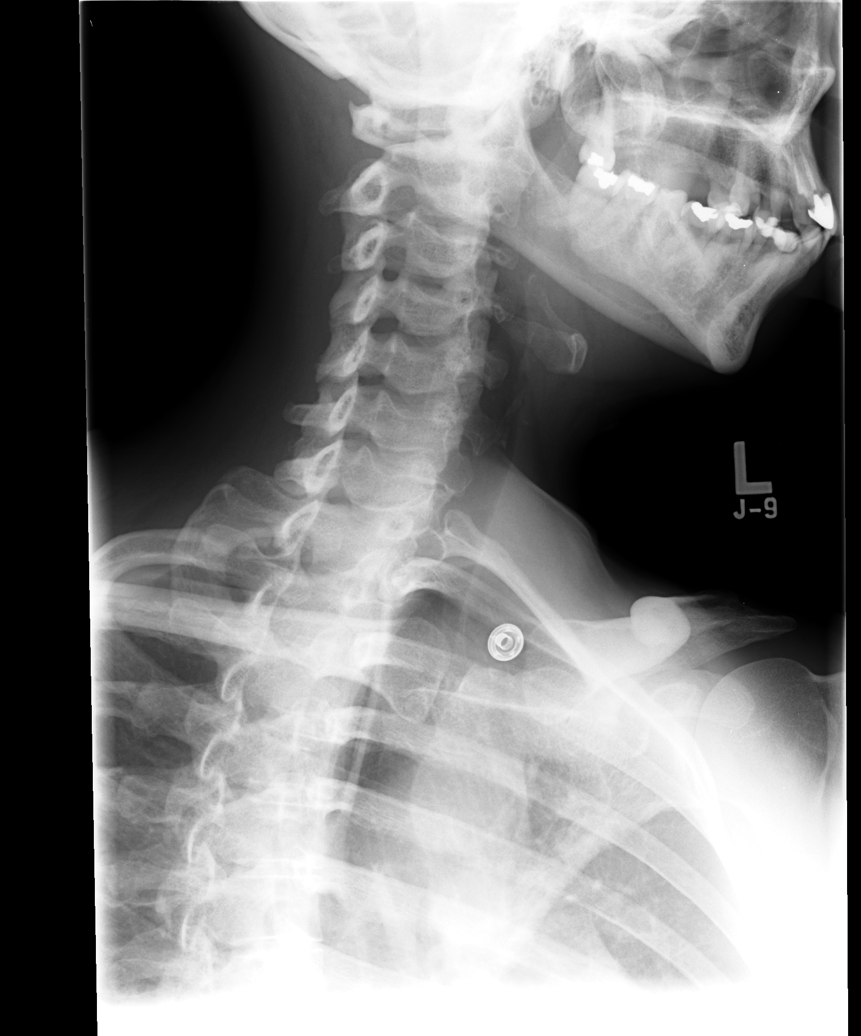

[view not recorded (3 of 5)]
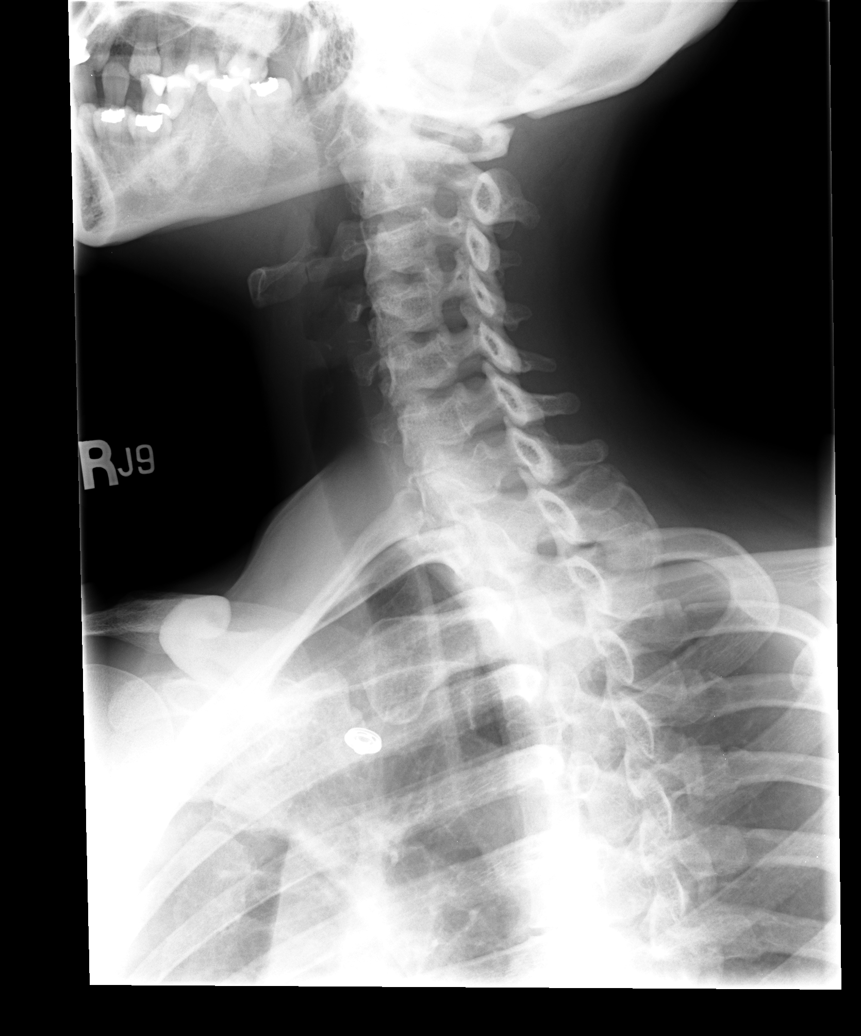

[view not recorded (4 of 5)]
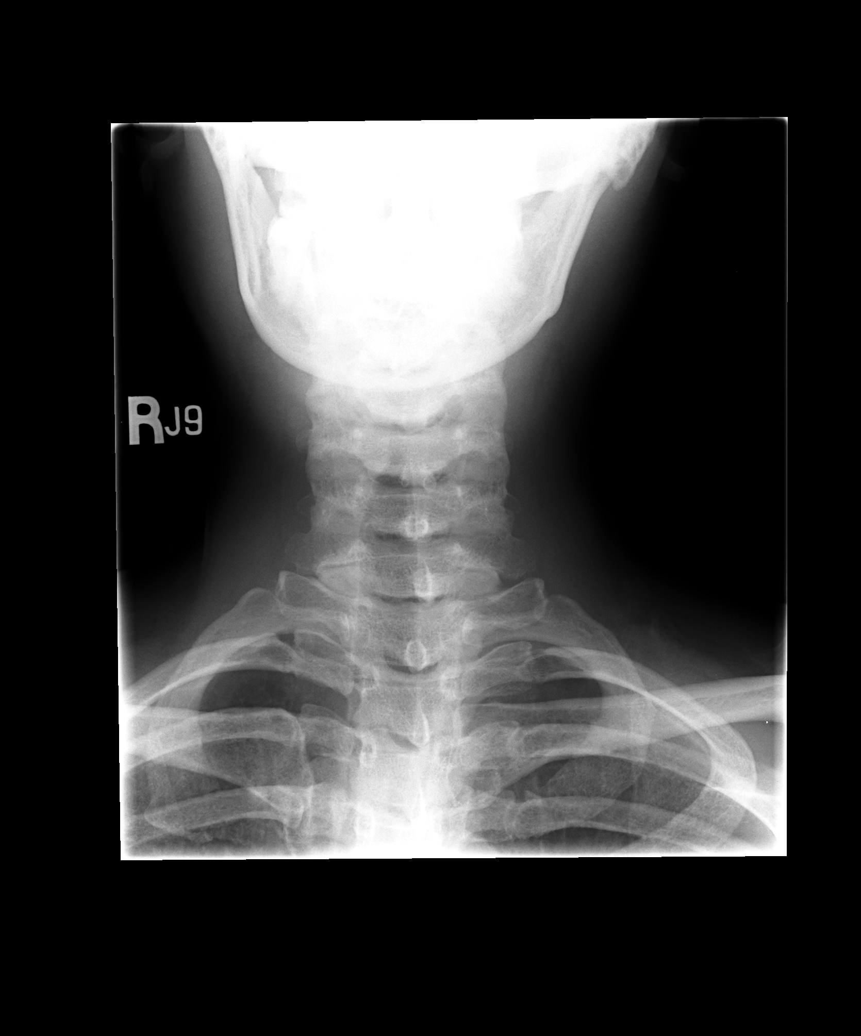

[view not recorded (5 of 5)]
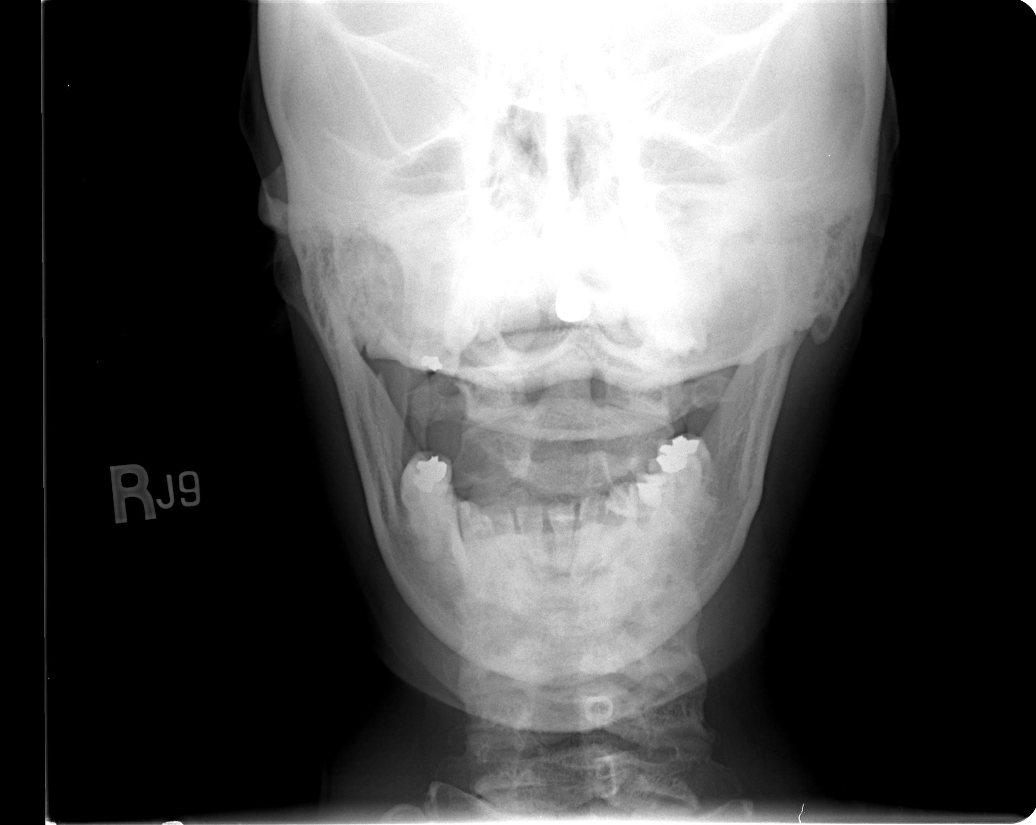

[5 of 5 positions shown; findings below may reference images not displayed]

FINDINGS: Alignment is normal.  Soft tissue shadows are normal.  No
evidence of degenerative disc disease or degenerative facet
disease.  No osteophytic encroachment upon the canal or foramina.
IMPRESSION: Normal radiographs

## 2013-11-26 IMAGING — CR DG LUMBAR SPINE COMPLETE 4+V
5 series · 5 of 5 positions shown · non-contrast
Comparison: 05/22/2006

CLINICAL DATA: Back pain extending to the left leg

LUMBAR SPINE - COMPLETE 4+ VIEW

[view not recorded (1 of 5)]
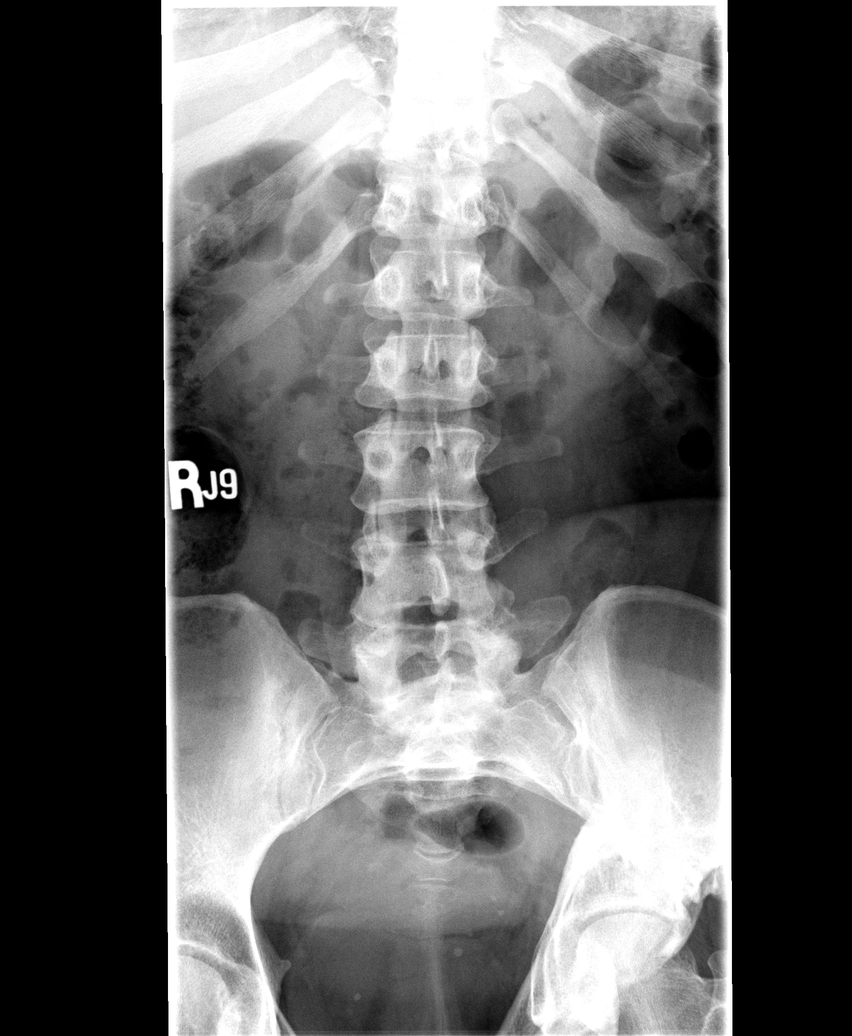

[view not recorded (2 of 5)]
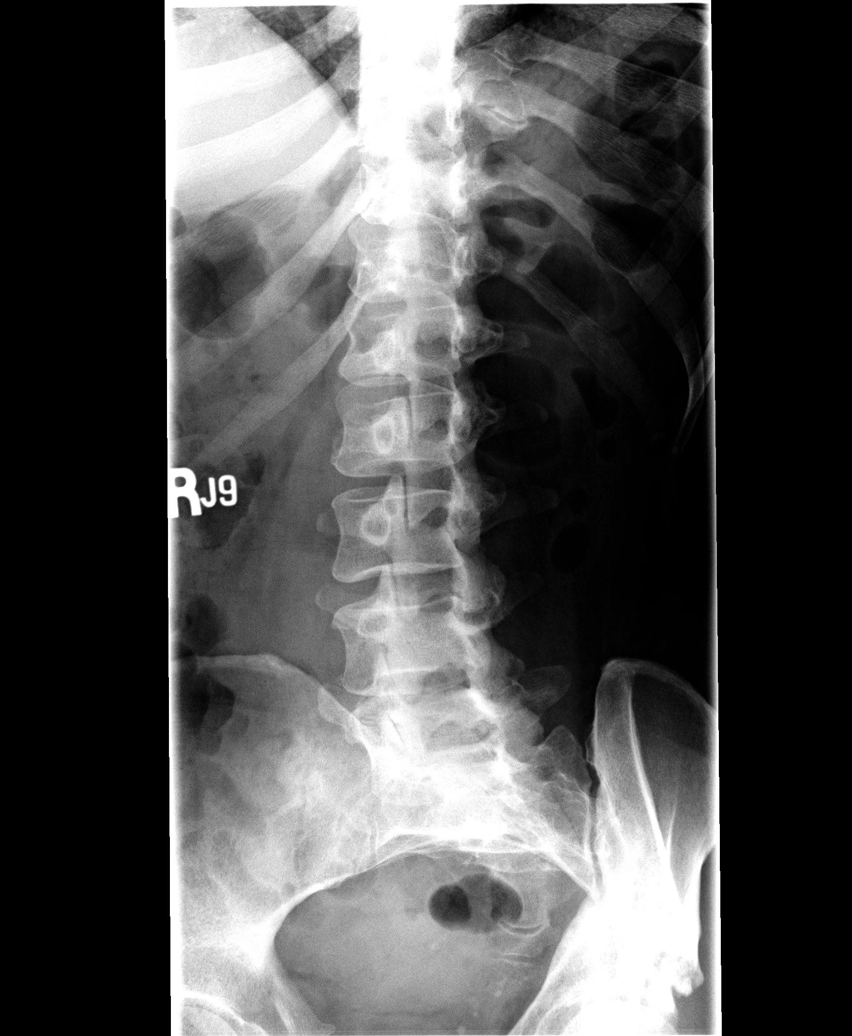

[view not recorded (3 of 5)]
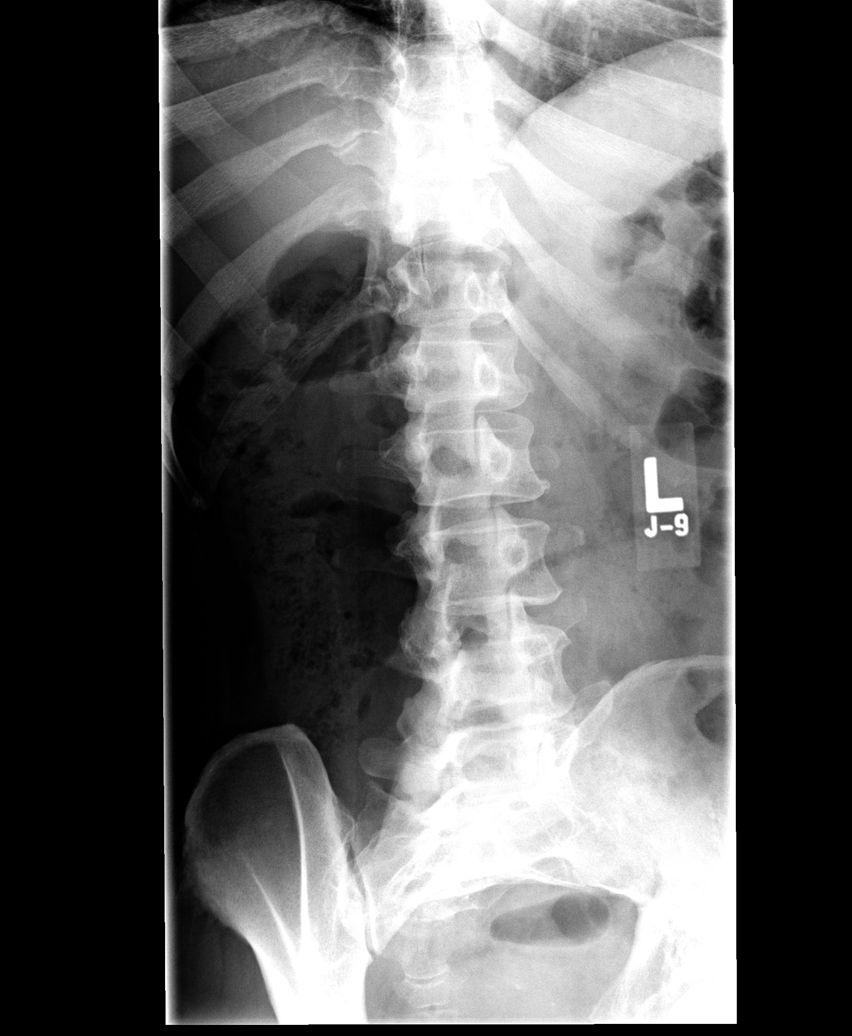

[view not recorded (4 of 5)]
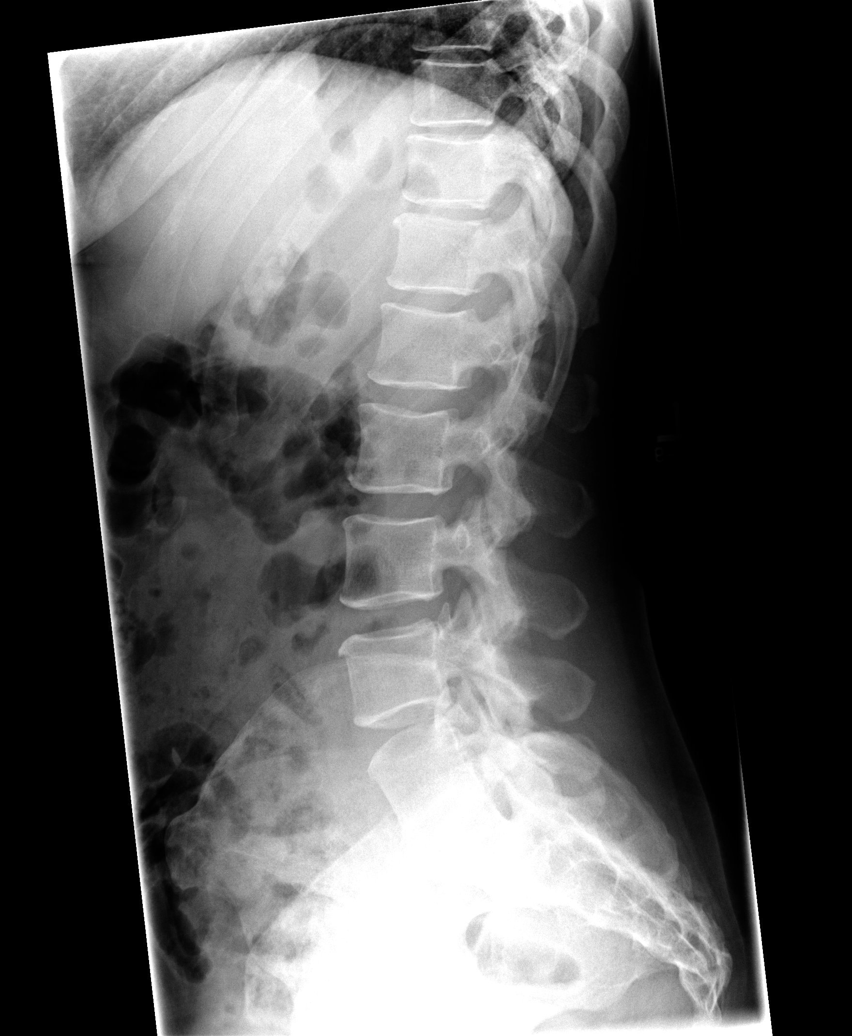

[view not recorded (5 of 5)]
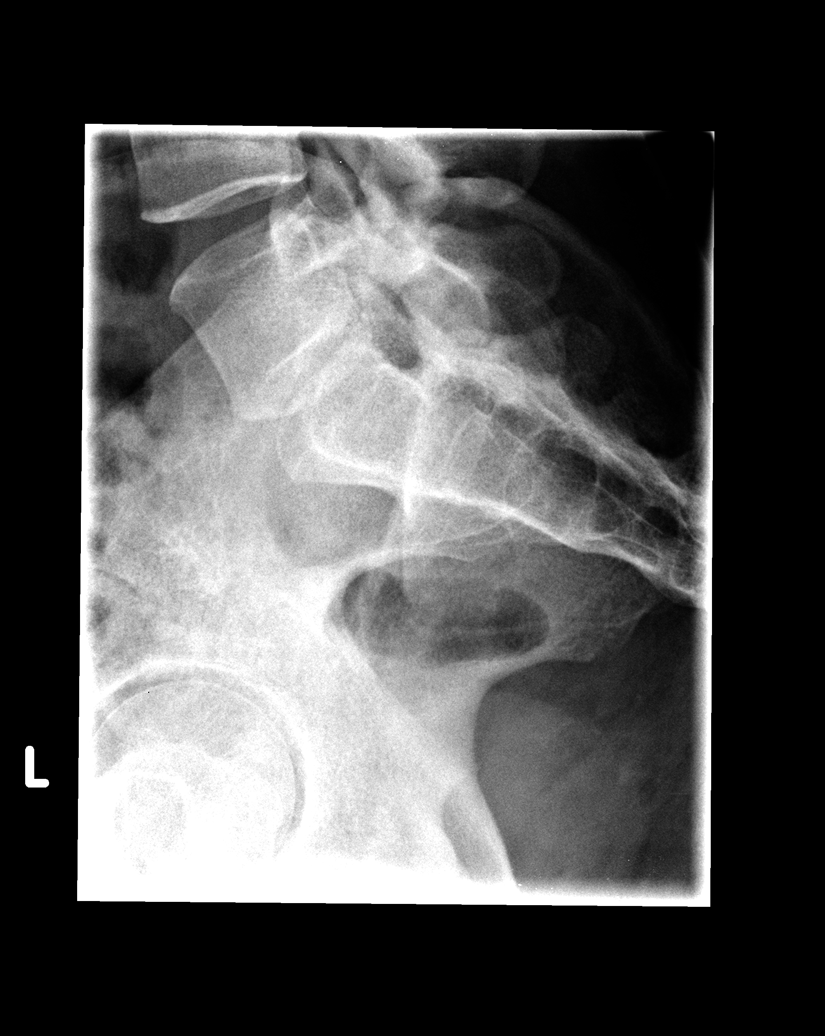

[5 of 5 positions shown; findings below may reference images not displayed]

FINDINGS: There is very minimal curvature convex to the right.
Disc space heights are within normal limits.  There is mild facet
degeneration at L4-5 and L5-S1.  No pars defect or slippage.
Sacroiliac joints appear normal.
IMPRESSION: Mild facet degeneration L4-5 and L5-S1.

## 2013-12-06 ENCOUNTER — Other Ambulatory Visit (HOSPITAL_COMMUNITY): Payer: Self-pay | Admitting: Family Medicine

## 2013-12-06 DIAGNOSIS — Z1231 Encounter for screening mammogram for malignant neoplasm of breast: Secondary | ICD-10-CM

## 2013-12-06 DIAGNOSIS — Z139 Encounter for screening, unspecified: Secondary | ICD-10-CM

## 2013-12-23 ENCOUNTER — Ambulatory Visit (HOSPITAL_COMMUNITY)
Admission: RE | Admit: 2013-12-23 | Discharge: 2013-12-23 | Disposition: A | Discharge status: Home / Self Care | Payer: Medicare Other | Source: Ambulatory Visit | Attending: Family Medicine | Admitting: Family Medicine

## 2013-12-23 DIAGNOSIS — Z1231 Encounter for screening mammogram for malignant neoplasm of breast: Secondary | ICD-10-CM

## 2013-12-27 ENCOUNTER — Ambulatory Visit: Payer: Medicare Other | Admitting: Obstetrics & Gynecology

## 2013-12-29 ENCOUNTER — Ambulatory Visit (INDEPENDENT_AMBULATORY_CARE_PROVIDER_SITE_OTHER): Payer: Medicare Other | Admitting: Obstetrics & Gynecology

## 2013-12-29 ENCOUNTER — Encounter: Payer: Self-pay | Admitting: Obstetrics & Gynecology

## 2013-12-29 VITALS — BP 100/60 | Ht 62.4 in | Wt 164.0 lb

## 2013-12-29 DIAGNOSIS — N921 Excessive and frequent menstruation with irregular cycle: Secondary | ICD-10-CM | POA: Diagnosis not present

## 2013-12-29 DIAGNOSIS — N946 Dysmenorrhea, unspecified: Secondary | ICD-10-CM

## 2013-12-29 MED ORDER — MEGESTROL ACETATE 40 MG PO TABS: ORAL_TABLET | ORAL | Status: DC

## 2014-01-28 ENCOUNTER — Other Ambulatory Visit: Payer: Medicare Other

## 2014-01-28 ENCOUNTER — Ambulatory Visit: Payer: Medicare Other | Admitting: Obstetrics & Gynecology

## 2014-01-31 ENCOUNTER — Telehealth: Payer: Self-pay | Admitting: *Deleted

## 2014-01-31 NOTE — Telephone Encounter (Signed)
Pt states had an ultrasound and appt with [facility] on Friday, r/s due to weather. Pt continues to have light vaginal bleeding, does she need to get the Megace refilled until she can be seen for her appt for u/s and Dr. Elonda Husky.

## 2014-01-31 NOTE — Telephone Encounter (Signed)
Pt aware to continue to take Megace until her appt with Dr. Elonda Husky. Pt verbalized understanding.

## 2014-01-31 NOTE — Telephone Encounter (Signed)
Yes stay on until otherwise instructed by Dr Elonda Husky

## 2014-02-02 ENCOUNTER — Encounter: Payer: Self-pay | Admitting: Obstetrics & Gynecology

## 2014-02-02 ENCOUNTER — Ambulatory Visit (INDEPENDENT_AMBULATORY_CARE_PROVIDER_SITE_OTHER): Payer: Medicare Other

## 2014-02-02 ENCOUNTER — Ambulatory Visit (INDEPENDENT_AMBULATORY_CARE_PROVIDER_SITE_OTHER): Payer: Medicare Other | Admitting: Obstetrics & Gynecology

## 2014-02-02 VITALS — BP 100/60 | Ht 62.0 in | Wt 164.0 lb

## 2014-02-02 DIAGNOSIS — N921 Excessive and frequent menstruation with irregular cycle: Secondary | ICD-10-CM

## 2014-02-02 DIAGNOSIS — N946 Dysmenorrhea, unspecified: Secondary | ICD-10-CM
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
## 2014-02-04 ENCOUNTER — Encounter: Payer: Self-pay | Admitting: Gastroenterology

## 2014-02-10 ENCOUNTER — Telehealth: Payer: Self-pay | Admitting: Obstetrics & Gynecology

## 2014-02-10 NOTE — Telephone Encounter (Signed)
Pt states has been having vaginal bleeding since she saw Dr. Elonda Husky on 02/02/14. Pt states when it snowed she did not take the Megace, does she need to take Megace daily or as before 3-5 days, 2-5 days,then 1 daily. Please advise.  Pt does have refills on the Megace.

## 2014-02-14 NOTE — Telephone Encounter (Signed)
Pt informed to begin Megace 3 for 5, 2 for 5 , than 1 daily.   Pt requesting refill on the Megace only has 1 refill left.

## 2014-02-14 NOTE — Telephone Encounter (Signed)
Start over as before 3 for 5, 2 for 5 then 1 a day

## 2014-03-09 NOTE — Progress Notes (Signed)
Patient ID: Kelsey Grant, female   DOB: 12-14-1965, 49 y.o.   MRN: [id number] Chief Complaint  Patient presents with  . gyn visit    c/c having 2 period in month/ hurting lower part of stomach.    Blood pressure 100/60, height 5' 2.4" (1.585 m), weight 164 lb (74.39 kg), last menstrual period 12/23/2013. P 80  Pt in with 2 periods in 1 month First time it has happened Periods are usually heavy and crampy as is this one  General WDWN female NAD Just did pelvic during yearly in summer which was normal Abdomen soft non tender non distended no guarding  Menometrorrhagia Dysmenorrhea  Megestrol therapy descending algorithm Follow up for sonogram in 1 month to evaluate for fibroids, other endometrial abnormalities after being on megestrol

## 2014-03-25 ENCOUNTER — Other Ambulatory Visit: Payer: Self-pay | Admitting: Obstetrics & Gynecology

## 2014-03-27 MED ORDER — MEGESTROL ACETATE 40 MG PO TABS: ORAL_TABLET | ORAL | Status: DC

## 2014-03-28 ENCOUNTER — Telehealth: Payer: Self-pay | Admitting: *Deleted

## 2014-03-28 NOTE — Telephone Encounter (Signed)
Spoke with pt. Pt called about her med at pharmacy but it has been taken care of. Encounter closed. [place]

## 2014-06-22 ENCOUNTER — Other Ambulatory Visit: Payer: Self-pay | Admitting: Obstetrics & Gynecology

## 2014-07-06 ENCOUNTER — Emergency Department (HOSPITAL_COMMUNITY): Payer: Medicare Other

## 2014-07-06 ENCOUNTER — Emergency Department (HOSPITAL_COMMUNITY)
Admission: EM | Admit: 2014-07-06 | Discharge: 2014-07-06 | Disposition: A | Discharge status: Home / Self Care | Payer: Medicare Other | Attending: Emergency Medicine | Admitting: Emergency Medicine

## 2014-07-06 ENCOUNTER — Encounter (HOSPITAL_COMMUNITY): Payer: Self-pay | Admitting: *Deleted

## 2014-07-06 DIAGNOSIS — Z793 Long term (current) use of hormonal contraceptives: Secondary | ICD-10-CM | POA: Diagnosis not present

## 2014-07-06 DIAGNOSIS — S79911A Unspecified injury of right hip, initial encounter: Secondary | ICD-10-CM | POA: Diagnosis present

## 2014-07-06 DIAGNOSIS — S76219A Strain of adductor muscle, fascia and tendon of unspecified thigh, initial encounter: Secondary | ICD-10-CM

## 2014-07-06 DIAGNOSIS — Y998 Other external cause status: Secondary | ICD-10-CM | POA: Diagnosis not present

## 2014-07-06 DIAGNOSIS — K219 Gastro-esophageal reflux disease without esophagitis: Secondary | ICD-10-CM | POA: Diagnosis not present

## 2014-07-06 DIAGNOSIS — E780 Pure hypercholesterolemia: Secondary | ICD-10-CM | POA: Insufficient documentation

## 2014-07-06 DIAGNOSIS — Y9289 Other specified places as the place of occurrence of the external cause: Secondary | ICD-10-CM | POA: Diagnosis not present

## 2014-07-06 DIAGNOSIS — G8929 Other chronic pain: Secondary | ICD-10-CM | POA: Insufficient documentation

## 2014-07-06 DIAGNOSIS — W1839XA Other fall on same level, initial encounter: Secondary | ICD-10-CM | POA: Diagnosis not present

## 2014-07-06 DIAGNOSIS — Y9389 Activity, other specified: Secondary | ICD-10-CM | POA: Insufficient documentation

## 2014-07-06 DIAGNOSIS — Z720 Tobacco use: Secondary | ICD-10-CM | POA: Insufficient documentation

## 2014-07-06 DIAGNOSIS — Z79899 Other long term (current) drug therapy: Secondary | ICD-10-CM | POA: Diagnosis not present

## 2014-07-06 DIAGNOSIS — S76211A Strain of adductor muscle, fascia and tendon of right thigh, initial encounter: Secondary | ICD-10-CM | POA: Insufficient documentation

## 2014-07-06 DIAGNOSIS — M25551 Pain in right hip: Secondary | ICD-10-CM

## 2014-07-06 MED ORDER — KETOROLAC TROMETHAMINE 10 MG PO TABS
10.0000 mg | ORAL_TABLET | Freq: Once | ORAL | Status: AC
  Administered 2014-07-06: 10 mg via ORAL
  Filled 2014-07-06: qty 1

## 2014-07-06 MED ORDER — BACLOFEN 10 MG PO TABS: 10.0000 mg | ORAL_TABLET | Freq: Three times a day (TID) | ORAL | Status: AC

## 2014-07-06 MED ORDER — DICLOFENAC SODIUM 75 MG PO TBEC: 75.0000 mg | DELAYED_RELEASE_TABLET | Freq: Two times a day (BID) | ORAL | Status: DC
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
## 2014-07-06 NOTE — Discharge Instructions (Signed)
Groin Strain °A groin strain (also called a groin pull) is an injury to the muscles or tendon on the upper inner part of the thigh. These muscles are called the adductor muscles or groin muscles. They are responsible for moving the leg across the body. A muscle strain occurs when a muscle is overstretched and some muscle fibers are torn. A groin strain can range from mild to severe depending on how many muscle fibers are affected and whether the muscle fibers are partially or completely torn.  °Groin strains usually occur during exercise or participation in sports. The injury often happens when a sudden, violent force is placed on a muscle, stretching the muscle too far. A strain is more likely to occur when your muscles are not warmed up or if you are not properly conditioned. Depending on the severity of the groin strain, recovery time may vary from a few weeks to several weeks. Severe injuries often require 4-6 weeks for recovery. In these cases, complete healing can take 4-5 months.  °CAUSES  °· Stretching the groin muscles too far or too suddenly, often during side-to-side motion with an abrupt change in direction. °· Putting repeated stress on the groin muscles over a long period of time. °· Performing vigorous activity without properly stretching the groin muscles beforehand. °SYMPTOMS  °· Pain and tenderness in the groin area. This begins as sharp pain and persists as a dull ache. °· Popping or snapping feeling when the injury occurs (for severe strains). °· Swelling or bruising. °· Muscle spasms. °· Weakness in the leg. °· Stiffness in the groin area with decreased ability to move the affected muscles. °DIAGNOSIS  °Your caregiver will perform a physical exam to diagnose a groin strain. You will be asked about your symptoms and how the injury occurred. X-rays are sometimes needed to rule out a broken bone or cartilage problems. Your caregiver may order a CT scan or MRI if a complete muscle tear is  suspected. °TREATMENT  °A groin strain will often heal on its own. Your caregiver may prescribe medicines to help manage pain and swelling (anti-inflammatory medicine). You may be told to use crutches for the first few days to minimize your pain. °HOME CARE INSTRUCTIONS  °· Rest. Do not use the strained muscle if it causes pain. °· Put ice on the injured area. °¨ Put ice in a plastic bag. °¨ Place a towel between your skin and the bag. °¨ Leave the ice on for 15-20 minutes, every 2-3 hours. Do this for the first 2 days after the injury.  °· Only take over-the-counter or prescription medicines as directed by your caregiver. °· Wrap the injured area with an elastic bandage as directed by your caregiver. °· Keep the injured leg raised (elevated). °· Walk, stretch, and perform range-of-motion exercises to improve blood flow to the injured area. Only perform these activities if you can do so without any pain. °To prevent muscle strains: °· Warm up before exercise. °· Develop proper conditioning and strength in the groin muscles. °SEEK IMMEDIATE MEDICAL CARE IF:  °· You have increased pain or swelling in the affected area.   °· Your symptoms are not improving or are getting worse. °MAKE SURE YOU:  °· Understand these instructions. °· Will watch your condition. °· Will get help right away if you are not doing well or get worse. °Document Released: 08/22/2003 Document Revised: 12/11/2011 Document Reviewed: 08/28/2011 °[employer]® Patient Information ©2015 [employer]. This information is not intended to replace advice given to you   by your health care provider. Make sure you discuss any questions you have with your health care provider. ° °

## 2014-07-06 NOTE — ED Notes (Signed)
Pain to right hip after falling on Monday. Pt is ambulating well.

## 2014-08-03 ENCOUNTER — Ambulatory Visit: Payer: Medicare Other | Admitting: Obstetrics & Gynecology

## 2014-08-03 ENCOUNTER — Other Ambulatory Visit: Payer: Medicare Other | Admitting: Obstetrics & Gynecology

## 2014-08-11 ENCOUNTER — Telehealth: Payer: Self-pay | Admitting: General Practice

## 2014-08-12 NOTE — Telephone Encounter (Signed)
Patient is to call with a medication list.

## 2014-08-19 ENCOUNTER — Other Ambulatory Visit: Payer: Self-pay | Admitting: Gastroenterology

## 2014-08-23 ENCOUNTER — Other Ambulatory Visit (HOSPITAL_COMMUNITY)
Admission: RE | Admit: 2014-08-23 | Discharge: 2014-08-23 | Disposition: A | Discharge status: Home / Self Care | Payer: Medicare Other | Source: Ambulatory Visit | Attending: Obstetrics & Gynecology | Admitting: Obstetrics & Gynecology

## 2014-08-23 ENCOUNTER — Encounter: Payer: Self-pay | Admitting: Obstetrics & Gynecology

## 2014-08-23 ENCOUNTER — Ambulatory Visit (INDEPENDENT_AMBULATORY_CARE_PROVIDER_SITE_OTHER): Payer: Medicare Other | Admitting: Obstetrics & Gynecology

## 2014-08-23 VITALS — BP 134/88 | HR 72 | Ht 62.0 in | Wt 176.0 lb

## 2014-08-23 DIAGNOSIS — Z01419 Encounter for gynecological examination (general) (routine) without abnormal findings: Secondary | ICD-10-CM | POA: Diagnosis not present

## 2014-08-23 DIAGNOSIS — Z0000 Encounter for general adult medical examination without abnormal findings: Secondary | ICD-10-CM

## 2014-08-23 DIAGNOSIS — Z124 Encounter for screening for malignant neoplasm of cervix: Secondary | ICD-10-CM | POA: Insufficient documentation

## 2014-08-23 MED ORDER — MEGESTROL ACETATE 40 MG PO TABS: ORAL_TABLET | ORAL | Status: DC
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
## 2014-08-25 LAB — CYTOLOGY - PAP

## 2014-12-02 ENCOUNTER — Other Ambulatory Visit (HOSPITAL_COMMUNITY): Payer: Self-pay | Admitting: Family Medicine

## 2014-12-02 DIAGNOSIS — Z1231 Encounter for screening mammogram for malignant neoplasm of breast: Secondary | ICD-10-CM

## 2014-12-26 ENCOUNTER — Ambulatory Visit (HOSPITAL_COMMUNITY)
Admission: RE | Admit: 2014-12-26 | Discharge: 2014-12-26 | Disposition: A | Discharge status: Home / Self Care | Payer: Medicare Other | Source: Ambulatory Visit | Attending: Family Medicine | Admitting: Family Medicine

## 2014-12-26 DIAGNOSIS — Z1231 Encounter for screening mammogram for malignant neoplasm of breast: Secondary | ICD-10-CM | POA: Insufficient documentation

## 2015-07-29 ENCOUNTER — Other Ambulatory Visit: Payer: Self-pay | Admitting: Nurse Practitioner

## 2015-08-09 ENCOUNTER — Other Ambulatory Visit: Payer: Self-pay | Admitting: Nurse Practitioner

## 2015-08-25 ENCOUNTER — Other Ambulatory Visit (HOSPITAL_COMMUNITY)
Admission: RE | Admit: 2015-08-25 | Discharge: 2015-08-25 | Disposition: A | Discharge status: Home / Self Care | Payer: Medicare Other | Source: Ambulatory Visit | Attending: Obstetrics & Gynecology | Admitting: Obstetrics & Gynecology

## 2015-08-25 ENCOUNTER — Ambulatory Visit (INDEPENDENT_AMBULATORY_CARE_PROVIDER_SITE_OTHER): Payer: Medicare Other | Admitting: Obstetrics & Gynecology

## 2015-08-25 ENCOUNTER — Encounter: Payer: Self-pay | Admitting: Obstetrics & Gynecology

## 2015-08-25 VITALS — BP 140/90 | HR 72 | Ht 62.0 in | Wt 179.0 lb

## 2015-08-25 DIAGNOSIS — Z01419 Encounter for gynecological examination (general) (routine) without abnormal findings: Secondary | ICD-10-CM | POA: Diagnosis present

## 2015-08-25 DIAGNOSIS — Z124 Encounter for screening for malignant neoplasm of cervix: Secondary | ICD-10-CM

## 2015-08-25 MED ORDER — MEGESTROL ACETATE 40 MG PO TABS: ORAL_TABLET | ORAL | 11 refills | Status: DC
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
## 2015-08-29 LAB — CYTOLOGY - PAP

## 2015-12-18 ENCOUNTER — Other Ambulatory Visit (HOSPITAL_COMMUNITY): Payer: Self-pay | Admitting: Family Medicine

## 2015-12-18 DIAGNOSIS — Z1231 Encounter for screening mammogram for malignant neoplasm of breast: Secondary | ICD-10-CM

## 2015-12-28 ENCOUNTER — Ambulatory Visit (HOSPITAL_COMMUNITY)
Admission: RE | Admit: 2015-12-28 | Discharge: 2015-12-28 | Disposition: A | Discharge status: Home / Self Care | Payer: Medicare Other | Source: Ambulatory Visit | Attending: Family Medicine | Admitting: Family Medicine

## 2015-12-28 ENCOUNTER — Encounter (HOSPITAL_COMMUNITY): Payer: Self-pay | Admitting: Radiology

## 2015-12-28 DIAGNOSIS — Z1231 Encounter for screening mammogram for malignant neoplasm of breast: Secondary | ICD-10-CM | POA: Insufficient documentation

## 2016-04-01 ENCOUNTER — Other Ambulatory Visit: Payer: Self-pay

## 2016-04-01 NOTE — Telephone Encounter (Signed)
LMOM that she needs ov prior to refills.

## 2016-04-01 NOTE — Telephone Encounter (Signed)
Please tell the patient it has been almost 3 years since we've seen her and she will need an OV for further refills

## 2016-04-18 ENCOUNTER — Other Ambulatory Visit: Payer: Self-pay | Admitting: Internal Medicine

## 2016-04-30 ENCOUNTER — Other Ambulatory Visit: Payer: Self-pay | Admitting: Internal Medicine

## 2016-05-23 ENCOUNTER — Telehealth: Payer: Self-pay | Admitting: Obstetrics & Gynecology

## 2016-05-23 NOTE — Telephone Encounter (Signed)
ERROR

## 2016-06-11 ENCOUNTER — Encounter: Payer: Medicare Other | Admitting: Obstetrics & Gynecology

## 2016-06-12 NOTE — Progress Notes (Signed)
Pt left prior to being seen   Florian Buff, MD   This encounter was created in error - please disregard.

## 2016-06-13 ENCOUNTER — Ambulatory Visit: Payer: Medicare Other | Admitting: Obstetrics & Gynecology

## 2016-06-20 ENCOUNTER — Encounter: Payer: Self-pay | Admitting: Obstetrics & Gynecology

## 2016-06-20 ENCOUNTER — Ambulatory Visit (INDEPENDENT_AMBULATORY_CARE_PROVIDER_SITE_OTHER): Payer: Medicare Other | Admitting: Obstetrics & Gynecology

## 2016-06-20 VITALS — BP 112/76 | HR 60 | Wt 177.0 lb

## 2016-06-20 DIAGNOSIS — N921 Excessive and frequent menstruation with irregular cycle: Secondary | ICD-10-CM | POA: Diagnosis not present

## 2016-06-20 DIAGNOSIS — N946 Dysmenorrhea, unspecified: Secondary | ICD-10-CM
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
## 2016-09-14 ENCOUNTER — Other Ambulatory Visit: Payer: Self-pay | Admitting: Obstetrics & Gynecology

## 2016-10-21 ENCOUNTER — Encounter: Payer: Self-pay | Admitting: Obstetrics & Gynecology

## 2016-10-21 ENCOUNTER — Other Ambulatory Visit (HOSPITAL_COMMUNITY)
Admission: RE | Admit: 2016-10-21 | Discharge: 2016-10-21 | Disposition: A | Discharge status: Home / Self Care | Payer: Medicare Other | Source: Ambulatory Visit | Attending: Obstetrics & Gynecology | Admitting: Obstetrics & Gynecology

## 2016-10-21 ENCOUNTER — Ambulatory Visit (INDEPENDENT_AMBULATORY_CARE_PROVIDER_SITE_OTHER): Payer: Medicare Other | Admitting: Obstetrics & Gynecology

## 2016-10-21 VITALS — BP 140/80 | HR 96 | Ht 63.0 in | Wt 176.0 lb

## 2016-10-21 DIAGNOSIS — Z124 Encounter for screening for malignant neoplasm of cervix: Secondary | ICD-10-CM

## 2016-10-21 DIAGNOSIS — Z01419 Encounter for gynecological examination (general) (routine) without abnormal findings: Secondary | ICD-10-CM | POA: Diagnosis not present

## 2016-10-21 MED ORDER — MEGESTROL ACETATE 40 MG PO TABS
ORAL_TABLET | ORAL | 11 refills | Status: DC
Start: 2016-10-21 — End: 2017-11-12
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
## 2016-10-24 LAB — CYTOLOGY - PAP
Chlamydia: NEGATIVE
DIAGNOSIS: NEGATIVE
HPV: NOT DETECTED
Neisseria Gonorrhea: NEGATIVE

## 2016-12-04 ENCOUNTER — Other Ambulatory Visit (HOSPITAL_COMMUNITY): Payer: Self-pay | Admitting: Family Medicine

## 2016-12-04 DIAGNOSIS — Z1231 Encounter for screening mammogram for malignant neoplasm of breast: Secondary | ICD-10-CM

## 2016-12-10 ENCOUNTER — Emergency Department (HOSPITAL_COMMUNITY)
Admission: EM | Admit: 2016-12-10 | Discharge: 2016-12-10 | Disposition: A | Discharge status: Home / Self Care | Payer: No Typology Code available for payment source | Attending: Emergency Medicine | Admitting: Emergency Medicine

## 2016-12-10 ENCOUNTER — Encounter (HOSPITAL_COMMUNITY): Payer: Self-pay | Admitting: Emergency Medicine

## 2016-12-10 ENCOUNTER — Emergency Department (HOSPITAL_COMMUNITY): Payer: No Typology Code available for payment source

## 2016-12-10 DIAGNOSIS — F17290 Nicotine dependence, other tobacco product, uncomplicated: Secondary | ICD-10-CM | POA: Diagnosis not present

## 2016-12-10 DIAGNOSIS — Z79899 Other long term (current) drug therapy: Secondary | ICD-10-CM | POA: Insufficient documentation

## 2016-12-10 DIAGNOSIS — Y9389 Activity, other specified: Secondary | ICD-10-CM | POA: Diagnosis not present

## 2016-12-10 DIAGNOSIS — S161XXA Strain of muscle, fascia and tendon at neck level, initial encounter: Secondary | ICD-10-CM | POA: Diagnosis not present

## 2016-12-10 DIAGNOSIS — S29019A Strain of muscle and tendon of unspecified wall of thorax, initial encounter: Secondary | ICD-10-CM | POA: Insufficient documentation

## 2016-12-10 DIAGNOSIS — S39012A Strain of muscle, fascia and tendon of lower back, initial encounter: Secondary | ICD-10-CM

## 2016-12-10 DIAGNOSIS — Y998 Other external cause status: Secondary | ICD-10-CM | POA: Insufficient documentation

## 2016-12-10 DIAGNOSIS — S199XXA Unspecified injury of neck, initial encounter: Secondary | ICD-10-CM | POA: Diagnosis present

## 2016-12-10 DIAGNOSIS — M549 Dorsalgia, unspecified: Secondary | ICD-10-CM | POA: Diagnosis not present

## 2016-12-10 DIAGNOSIS — Y92410 Unspecified street and highway as the place of occurrence of the external cause: Secondary | ICD-10-CM | POA: Insufficient documentation

## 2016-12-10 MED ORDER — NAPROXEN 500 MG PO TABS: 500.0000 mg | ORAL_TABLET | Freq: Two times a day (BID) | ORAL | 0 refills | Status: DC

## 2016-12-10 MED ORDER — KETOROLAC TROMETHAMINE 60 MG/2ML IM SOLN
60.0000 mg | Freq: Once | INTRAMUSCULAR | Status: AC
  Administered 2016-12-10: 60 mg via INTRAMUSCULAR
  Filled 2016-12-10: qty 2

## 2016-12-10 MED ORDER — CYCLOBENZAPRINE HCL 10 MG PO TABS: 10.0000 mg | ORAL_TABLET | Freq: Three times a day (TID) | ORAL | 0 refills | Status: DC | PRN
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
## 2017-01-03 ENCOUNTER — Other Ambulatory Visit: Payer: Self-pay | Admitting: Gastroenterology

## 2017-01-06 ENCOUNTER — Ambulatory Visit (HOSPITAL_COMMUNITY): Payer: Medicare Other

## 2017-02-14 ENCOUNTER — Other Ambulatory Visit (HOSPITAL_COMMUNITY): Payer: Self-pay | Admitting: Family Medicine

## 2017-02-14 DIAGNOSIS — Z1231 Encounter for screening mammogram for malignant neoplasm of breast: Secondary | ICD-10-CM

## 2017-02-24 ENCOUNTER — Encounter (HOSPITAL_COMMUNITY): Payer: Self-pay

## 2017-02-24 ENCOUNTER — Ambulatory Visit (HOSPITAL_COMMUNITY)
Admission: RE | Admit: 2017-02-24 | Discharge: 2017-02-24 | Disposition: A | Discharge status: Home / Self Care | Payer: Medicare Other | Source: Ambulatory Visit | Attending: Family Medicine | Admitting: Family Medicine

## 2017-02-24 DIAGNOSIS — Z1231 Encounter for screening mammogram for malignant neoplasm of breast: Secondary | ICD-10-CM | POA: Diagnosis not present

## 2017-03-14 LAB — LIPID PANEL
Cholesterol: 159 (ref 0–200)
HDL: 55 (ref 35–70)
LDL CALC: 89
Triglycerides: 67 (ref 40–160)

## 2017-03-14 LAB — BASIC METABOLIC PANEL
Creatinine: 0.9 (ref <=1.1)
Potassium: 3.9 (ref 3.4–5.3)
SODIUM: 142 (ref 137–147)

## 2017-03-14 LAB — TSH: TSH: 1.69 (ref <=5.90)

## 2017-04-15 ENCOUNTER — Encounter (HOSPITAL_COMMUNITY): Payer: Self-pay

## 2017-04-15 ENCOUNTER — Emergency Department (HOSPITAL_COMMUNITY)
Admission: EM | Admit: 2017-04-15 | Discharge: 2017-04-15 | Disposition: A | Discharge status: Home / Self Care | Payer: Medicare Other | Attending: Emergency Medicine | Admitting: Emergency Medicine

## 2017-04-15 ENCOUNTER — Other Ambulatory Visit: Payer: Self-pay

## 2017-04-15 DIAGNOSIS — G8929 Other chronic pain: Secondary | ICD-10-CM | POA: Insufficient documentation

## 2017-04-15 DIAGNOSIS — Z5321 Procedure and treatment not carried out due to patient leaving prior to being seen by health care provider: Secondary | ICD-10-CM | POA: Insufficient documentation

## 2017-04-15 NOTE — ED Notes (Signed)
Pt called X 3 for room assignment. No answer. Pt will be moved OTF.

## 2017-04-15 NOTE — ED Notes (Signed)
Called pt name x3 to recheck vitals, no response from pt.
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
## 2017-05-07 ENCOUNTER — Encounter: Payer: Self-pay | Admitting: Orthopedic Surgery

## 2017-05-07 ENCOUNTER — Ambulatory Visit: Payer: Self-pay | Admitting: Orthopedic Surgery
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
## 2017-05-13 ENCOUNTER — Ambulatory Visit (INDEPENDENT_AMBULATORY_CARE_PROVIDER_SITE_OTHER): Payer: Medicare Other | Admitting: Family Medicine

## 2017-05-13 ENCOUNTER — Other Ambulatory Visit: Payer: Self-pay

## 2017-05-13 ENCOUNTER — Encounter: Payer: Self-pay | Admitting: Family Medicine

## 2017-05-13 DIAGNOSIS — F1190 Opioid use, unspecified, uncomplicated: Secondary | ICD-10-CM | POA: Diagnosis not present

## 2017-05-13 DIAGNOSIS — M1612 Unilateral primary osteoarthritis, left hip: Secondary | ICD-10-CM | POA: Insufficient documentation

## 2017-05-13 DIAGNOSIS — M47816 Spondylosis without myelopathy or radiculopathy, lumbar region: Secondary | ICD-10-CM | POA: Diagnosis not present

## 2017-05-13 DIAGNOSIS — G894 Chronic pain syndrome: Secondary | ICD-10-CM | POA: Insufficient documentation

## 2017-05-13 DIAGNOSIS — E782 Mixed hyperlipidemia: Secondary | ICD-10-CM

## 2017-05-13 MED ORDER — DULOXETINE HCL 20 MG PO CPEP: 20.0000 mg | ORAL_CAPSULE | Freq: Every day | ORAL | 3 refills | Status: DC
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
## 2017-05-13 NOTE — Patient Instructions (Signed)
Please return in 3 months for recheck.   We will call you with information regarding your referral appointment. orthopedics  Today start the cymbalta daily to see if this will help with your pain.  You may try using the neurontin just at night.  Stop the naprosyn and the relafen.   It was a pleasure meeting you today! Thank you for choosing [place] to meet your healthcare needs! I truly look forward to working with you. If you have any questions or concerns, please send me a message via Mychart or call the office at [phone].

## 2017-05-23 ENCOUNTER — Other Ambulatory Visit: Payer: Self-pay | Admitting: Sports Medicine

## 2017-05-23 DIAGNOSIS — M545 Low back pain, unspecified: Secondary | ICD-10-CM

## 2017-05-23 DIAGNOSIS — M25551 Pain in right hip: Secondary | ICD-10-CM

## 2017-05-23 DIAGNOSIS — M25552 Pain in left hip: Secondary | ICD-10-CM

## 2017-05-31 ENCOUNTER — Ambulatory Visit
Admission: RE | Admit: 2017-05-31 | Discharge: 2017-05-31 | Disposition: A | Discharge status: Home / Self Care | Payer: Medicare Other | Source: Ambulatory Visit | Attending: Sports Medicine | Admitting: Sports Medicine

## 2017-05-31 ENCOUNTER — Encounter: Payer: Self-pay | Admitting: Family Medicine

## 2017-05-31 DIAGNOSIS — M545 Low back pain, unspecified: Secondary | ICD-10-CM

## 2017-05-31 DIAGNOSIS — M25551 Pain in right hip: Secondary | ICD-10-CM

## 2017-05-31 DIAGNOSIS — M25552 Pain in left hip: Principal | ICD-10-CM

## 2017-06-03 MED ORDER — PRAVASTATIN SODIUM 40 MG PO TABS: 40.0000 mg | ORAL_TABLET | Freq: Every day | ORAL | 3 refills | Status: DC

## 2017-06-03 MED ORDER — OMEPRAZOLE 20 MG PO CPDR: 20.0000 mg | DELAYED_RELEASE_CAPSULE | Freq: Every day | ORAL | 3 refills | Status: DC

## 2017-06-03 NOTE — Telephone Encounter (Signed)
Please advise 

## 2017-08-11 ENCOUNTER — Ambulatory Visit (INDEPENDENT_AMBULATORY_CARE_PROVIDER_SITE_OTHER): Payer: Medicare Other | Admitting: Family Medicine

## 2017-08-11 ENCOUNTER — Other Ambulatory Visit: Payer: Self-pay

## 2017-08-11 ENCOUNTER — Encounter: Payer: Self-pay | Admitting: Family Medicine

## 2017-08-11 VITALS — BP 124/80 | HR 87 | Temp 99.0°F | Ht 63.0 in | Wt 190.0 lb

## 2017-08-11 DIAGNOSIS — M4716 Other spondylosis with myelopathy, lumbar region: Secondary | ICD-10-CM

## 2017-08-11 DIAGNOSIS — G894 Chronic pain syndrome: Secondary | ICD-10-CM

## 2017-08-11 MED ORDER — AMITRIPTYLINE HCL 150 MG PO TABS: 150.0000 mg | ORAL_TABLET | Freq: Every day | ORAL | 5 refills | Status: DC

## 2017-08-11 NOTE — Assessment & Plan Note (Signed)
Remains off narcotics which is good. Tramadol per ortho but no significant pain relief. Trial of elavil. Wean cymbalta. rec pt and active lifestlye

## 2017-08-11 NOTE — Assessment & Plan Note (Signed)
Verified by MRI: rec f/u with Dr. Ethelene Hal, consideration of injections and physical therapy. Stop cymbalta and trial of elavil. Monitor while on tramadol. Avoid narcotics.

## 2017-08-11 NOTE — Patient Instructions (Addendum)
Please follow up with Dr. Ethelene Hal for further management options for your pain.   Try the elavil at night for the next 3 months to see if that helps with pain control.   Consider physical therapy for your neck pain.   We will see you for your physical in February 2020.

## 2017-08-11 NOTE — Progress Notes (Signed)
Subjective  CC:  Chief Complaint  Patient presents with  . Back Pain    patient states that she is still having pain in back and legs    HPI: Kelsey Grant is a 52 y.o. female who presents to the office today to address the problems listed above in the chief complaint.  Pt was referred to ortho: no notes available but reviewed MRI lumbar and hip reports. + findings. Dr. Ethelene Hal gave tramadol and recommended steroid epidural injections if needed. Pt reports neck and low back continue to hurt. She is active. Doesn't feel cymbalta has made any difference. Has good outlook. Glad to be off of narcotics.   Intolerant to gabapentin: "made me sick"   Assessment  1. Osteoarthritis of lumbar spine with myelopathy   2. Chronic pain syndrome      Plan   Pain and DJD/myelopathy:  Per ortho recs. Verified by MRI: rec f/u with Dr. Ethelene Hal, consideration of injections and physical therapy. Stop cymbalta and trial of elavil. Monitor while on tramadol. Avoid narcotics. See below.  Educated. Continue cymbalta. Stop gabapentin. Consider lyrica.   Follow up: Return for cpe in feb 2020; f/u with Dr. Ethelene Hal.   No orders of the defined types were placed in this encounter.  Meds ordered this encounter  Medications  . amitriptyline (ELAVIL) 150 MG tablet    Sig: Take 1 tablet (150 mg total) by mouth at bedtime.    Dispense:  30 tablet    Refill:  5      I reviewed the patients updated PMH, FH, and SocHx.    Patient Active Problem List   Diagnosis Date Noted  . Chronic pain syndrome 05/13/2017    Priority: High  . Chronic narcotic use 05/13/2017    Priority: High  . Mixed hyperlipidemia 05/13/2017    Priority: High  . Localized osteoarthrosis of left hip 05/13/2017    Priority: High  . 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 FH: colon cancer 04/25/2011    Priority: High  . GERD (gastroesophageal reflux disease) 10/16/2011    Priority: Medium   . Esophageal stricture 04/25/2011    Priority: Medium  . Colon cancer screening 10/16/2011   Current Meds  Medication Sig  . cholecalciferol (VITAMIN D) 400 units TABS tablet Take 400 Units by mouth.  . megestrol (MEGACE) 40 MG tablet TAKE 3 TABLETS BY MOUTH ONCE DAILY FOR 5 DAYS, 2 TABLETS FOR 5 DAYS, THEN 1 TABLET DAILY THEREAFTER.  Marland Kitchen omeprazole (PRILOSEC) 20 MG capsule Take 1-2 capsules (20-40 mg total) by mouth daily.  . pravastatin (PRAVACHOL) 40 MG tablet Take 1 tablet (40 mg total) by mouth daily.  . traMADol (ULTRAM) 50 MG tablet   . [DISCONTINUED] cyclobenzaprine (FLEXERIL) 10 MG tablet Take 1 tablet (10 mg total) by mouth 3 (three) times daily as needed for muscle spasms.  . [DISCONTINUED] diclofenac (VOLTAREN) 75 MG EC tablet Take 1 tablet (75 mg total) by mouth 2 (two) times daily.  . [DISCONTINUED] DULoxetine (CYMBALTA) 20 MG capsule Take 1 capsule (20 mg total) by mouth daily.    Allergies: Patient has No Known Allergies. Family History: Patient family history includes Arthritis in her mother; Cancer in her father; Colon cancer in her father; Diabetes in her brother, brother, father, and mother; GER disease in her son; Hyperlipidemia in her brother and father; Hypertension in her brother, brother, and father; Thrombosis in her mother. Social History:  Patient  reports  that she has been smoking cigars.  She has smoked for the past 4.00 years. She has never used smokeless tobacco. She reports that she drinks alcohol. She reports that she has current or past drug history. Drug: Marijuana.  Review of Systems: Constitutional: Negative for fever malaise or anorexia Cardiovascular: negative for chest pain Respiratory: negative for SOB or persistent cough Gastrointestinal: negative for abdominal pain  Objective  Vitals: BP 124/80   Pulse 87   Temp 99 F (37.2 C)   Ht 5\' 3"  (1.6 m)   Wt 190 lb (86.2 kg)   SpO2 98%   BMI 33.66 kg/m  General: no acute distress ,  A&Ox3 Appears well. Moves easily.     Commons side effects, risks, benefits, and alternatives for medications and treatment plan prescribed today were discussed, and the patient expressed understanding of the given instructions. Patient is instructed to call or message via MyChart if he/she has any questions or concerns regarding our treatment plan. No barriers to understanding were identified. We discussed Red Flag symptoms and signs in detail. Patient expressed understanding regarding what to do in case of urgent or emergency type symptoms.   Medication list was reconciled, printed and provided to the patient in AVS. Patient instructions and summary information was reviewed with the patient as documented in the AVS. This note was prepared with assistance of Dragon voice recognition software. Occasional wrong-word or sound-a-like substitutions may have occurred due to the inherent limitations of voice recognition software

## 2017-11-05 ENCOUNTER — Other Ambulatory Visit: Payer: Self-pay | Admitting: Obstetrics & Gynecology

## 2017-11-08 ENCOUNTER — Encounter (HOSPITAL_COMMUNITY): Payer: Self-pay | Admitting: Emergency Medicine

## 2017-11-08 ENCOUNTER — Emergency Department (HOSPITAL_COMMUNITY)
Admission: EM | Admit: 2017-11-08 | Discharge: 2017-11-08 | Disposition: A | Discharge status: Home / Self Care | Payer: Medicare Other | Attending: Emergency Medicine | Admitting: Emergency Medicine

## 2017-11-08 ENCOUNTER — Other Ambulatory Visit: Payer: Self-pay

## 2017-11-08 DIAGNOSIS — F17290 Nicotine dependence, other tobacco product, uncomplicated: Secondary | ICD-10-CM | POA: Insufficient documentation

## 2017-11-08 DIAGNOSIS — R600 Localized edema: Secondary | ICD-10-CM | POA: Diagnosis present

## 2017-11-08 DIAGNOSIS — Z79899 Other long term (current) drug therapy: Secondary | ICD-10-CM | POA: Diagnosis not present

## 2017-11-08 DIAGNOSIS — T7840XA Allergy, unspecified, initial encounter: Secondary | ICD-10-CM | POA: Diagnosis not present

## 2017-11-08 DIAGNOSIS — L299 Pruritus, unspecified: Secondary | ICD-10-CM | POA: Insufficient documentation

## 2017-11-08 MED ORDER — TETRACAINE HCL 0.5 % OP SOLN
1.0000 [drp] | Freq: Once | OPHTHALMIC | Status: DC
  Filled 2017-11-08: qty 4

## 2017-11-08 MED ORDER — LORATADINE 10 MG PO TABS: 10.0000 mg | ORAL_TABLET | Freq: Every day | ORAL | 0 refills | Status: DC

## 2017-11-08 MED ORDER — FLUORESCEIN SODIUM 1 MG OP STRP
1.0000 | ORAL_STRIP | Freq: Once | OPHTHALMIC | Status: DC
  Filled 2017-11-08: qty 1

## 2017-11-08 MED ORDER — DIPHENHYDRAMINE HCL 25 MG PO TABS: 25.0000 mg | ORAL_TABLET | Freq: Every evening | ORAL | 0 refills | Status: DC | PRN

## 2017-11-08 NOTE — ED Triage Notes (Signed)
Patient awoke with swelling in the lower right eye. The next day she noticed it in the left eye as well. Reports itching of the eyes.

## 2017-11-08 NOTE — Discharge Instructions (Addendum)
Apply cool compresses to the area. Follow up with the eye doctor if symptoms persist. Return here as needed.

## 2017-11-08 NOTE — ED Notes (Signed)
Visual acuity: 20/15 in left eye and 20/13 in the right eye.

## 2017-11-08 NOTE — ED Notes (Signed)
Bed: WTR8 Expected date:  Expected time:  Means of arrival:  Comments: 
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
## 2017-11-11 ENCOUNTER — Ambulatory Visit: Payer: Medicare Other | Admitting: Family Medicine

## 2017-11-12 ENCOUNTER — Encounter: Payer: Self-pay | Admitting: Family Medicine

## 2017-11-12 ENCOUNTER — Other Ambulatory Visit: Payer: Self-pay

## 2017-11-12 ENCOUNTER — Ambulatory Visit (INDEPENDENT_AMBULATORY_CARE_PROVIDER_SITE_OTHER): Payer: Medicare Other | Admitting: Family Medicine

## 2017-11-12 VITALS — BP 130/82 | HR 98 | Temp 98.4°F | Ht 62.0 in | Wt 192.8 lb

## 2017-11-12 DIAGNOSIS — G894 Chronic pain syndrome: Secondary | ICD-10-CM | POA: Diagnosis not present

## 2017-11-12 DIAGNOSIS — M7741 Metatarsalgia, right foot: Secondary | ICD-10-CM | POA: Diagnosis not present

## 2017-11-12 DIAGNOSIS — M47816 Spondylosis without myelopathy or radiculopathy, lumbar region: Secondary | ICD-10-CM

## 2017-11-12 MED ORDER — NORTRIPTYLINE HCL 25 MG PO CAPS: 25.0000 mg | ORAL_CAPSULE | Freq: Every day | ORAL | 5 refills | Status: DC

## 2017-11-12 NOTE — Patient Instructions (Addendum)
Please return in February for your complete physical. Please come fasting.   If you have any questions or concerns, please don't hesitate to send me a message via MyChart or call the office at [phone]. Thank you for visiting with [place] today! It's our pleasure caring for you.  Buy a metatarsal pad for your right foot.   We will call you with information regarding your referral appointment. Pain management.  If you do not hear from [place] within the next 2 weeks, please let me know. It can take 1-2 weeks to get appointments set up with the specialists.

## 2017-11-12 NOTE — Progress Notes (Signed)
Subjective  CC:  Chief Complaint  Patient presents with  . Pain    unable to take the Amitriptyline, due to drowsiness. Patient states she stays in Pain, declines flu and tetanus      HPI: Kelsey Grant is a 52 y.o. female who presents to the office today to address the problems listed above in the chief complaint.  Kelsey Grant reports that she continues to have pain that is bothersome.  She is taking tramadol per [facility] however does not feel that it is helpful.  She reports she has been back to Ortho couple times, they offered epidural steroid injections and physical therapy.  She declines both.  She has been on chronic narcotics since 1996.  She feels this is the best strategy for her.  No new symptoms.  We tried amitriptyline but high-dose cause significant side effects.  Also complains of pain beneath the ball of her right foot while walking or stepping up on steps.  No injury.  No swelling.  No trauma.  No midfoot pain.  Health maintenance: Patient declines any immunizations. Assessment  1. Chronic pain syndrome   2. Spondylosis of lumbar region without myelopathy or radiculopathy   3. Metatarsalgia of right foot      Plan   Chronic pain syndrome with history of long-term opioid treatment: Refer to a pain management clinic to better assess her needs.  Try nortriptyline 25 mg nightly.  Continue Cymbalta.  Continue tramadol for now.  Recommend metatarsal pad for metatarsalgia.  Education given.  Return in February for complete physical.  Educated on vaccinations.  Patient strongly declines them.  Follow up: Return in about 3 months (around 02/12/2018) for complete physical.   Orders Placed This Encounter  Procedures  . Ambulatory referral to Pain Clinic   Meds ordered this encounter  Medications  . nortriptyline (PAMELOR) 25 MG capsule    Sig: Take 1 capsule (25 mg total) by mouth at bedtime.    Dispense:  30 capsule    Refill:  5      I reviewed the patients  updated PMH, FH, and SocHx.    Patient Active Problem List   Diagnosis Date Noted  . Chronic pain syndrome 05/13/2017    Priority: High  . Chronic narcotic use 05/13/2017    Priority: High  . Mixed hyperlipidemia 05/13/2017    Priority: High  . Localized osteoarthrosis of left hip 05/13/2017    Priority: High  . 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 FH: colon cancer 04/25/2011    Priority: High  . GERD (gastroesophageal reflux disease) 10/16/2011    Priority: Medium  . Esophageal stricture 04/25/2011    Priority: Medium  . Colon cancer screening 10/16/2011   Current Meds  Medication Sig  . megestrol (MEGACE) 40 MG tablet TAKE 1 TABLET BY MOUTH ONCE DAILY.  Marland Kitchen omeprazole (PRILOSEC) 20 MG capsule Take 1-2 capsules (20-40 mg total) by mouth daily.  . pravastatin (PRAVACHOL) 40 MG tablet Take 1 tablet (40 mg total) by mouth daily.  . traMADol (ULTRAM) 50 MG tablet   . [DISCONTINUED] megestrol (MEGACE) 40 MG tablet TAKE 3 TABLETS BY MOUTH ONCE DAILY FOR 5 DAYS, 2 TABLETS FOR 5 DAYS, THEN 1 TABLET DAILY THEREAFTER.    Allergies: Patient has No Known Allergies. Family History: Patient family history includes Arthritis in her mother; Cancer in her father; Colon cancer in her father; Diabetes in her brother, brother, father, and mother;  GER disease in her son; Hyperlipidemia in her brother and father; Hypertension in her brother, brother, and father; Thrombosis in her mother. Social History:  Patient  reports that she has been smoking cigars. She has smoked for the past 4.00 years. She has never used smokeless tobacco. She reports that she drinks alcohol. She reports that she has current or past drug history. Drug: Marijuana.  Review of Systems: Constitutional: Negative for fever malaise or anorexia Cardiovascular: negative for chest pain Respiratory: negative for SOB or persistent cough Gastrointestinal: negative for abdominal  pain  Objective  Vitals: BP 130/82   Pulse 98   Temp 98.4 F (36.9 C)   Ht 5\' 2"  (1.575 m)   Wt 192 lb 12.8 oz (87.5 kg)   SpO2 98%   BMI 35.26 kg/m  General: no acute distress , A&Ox3, appears well.  Moves easily.  Normal gait Right foot: Tender over head of second metatarsal.  No neuroma palpable.    Commons side effects, risks, benefits, and alternatives for medications and treatment plan prescribed today were discussed, and the patient expressed understanding of the given instructions. Patient is instructed to call or message via MyChart if he/she has any questions or concerns regarding our treatment plan. No barriers to understanding were identified. We discussed Red Flag symptoms and signs in detail. Patient expressed understanding regarding what to do in case of urgent or emergency type symptoms.   Medication list was reconciled, printed and provided to the patient in AVS. Patient instructions and summary information was reviewed with the patient as documented in the AVS. This note was prepared with assistance of Dragon voice recognition software. Occasional wrong-word or sound-a-like substitutions may have occurred due to the inherent limitations of voice recognition software

## 2017-12-02 ENCOUNTER — Encounter: Payer: Self-pay | Admitting: Family Medicine

## 2018-02-12 ENCOUNTER — Other Ambulatory Visit: Payer: Self-pay

## 2018-02-12 ENCOUNTER — Ambulatory Visit (INDEPENDENT_AMBULATORY_CARE_PROVIDER_SITE_OTHER): Payer: Medicare Other | Admitting: Family Medicine

## 2018-02-12 ENCOUNTER — Encounter: Payer: Self-pay | Admitting: Family Medicine

## 2018-02-12 VITALS — BP 134/82 | HR 85 | Temp 98.3°F | Resp 16 | Ht 62.0 in | Wt 197.4 lb

## 2018-02-12 DIAGNOSIS — E782 Mixed hyperlipidemia: Secondary | ICD-10-CM | POA: Diagnosis not present

## 2018-02-12 DIAGNOSIS — G894 Chronic pain syndrome: Secondary | ICD-10-CM | POA: Diagnosis not present

## 2018-02-12 DIAGNOSIS — M47816 Spondylosis without myelopathy or radiculopathy, lumbar region: Secondary | ICD-10-CM

## 2018-02-12 DIAGNOSIS — Z114 Encounter for screening for human immunodeficiency virus [HIV]: Secondary | ICD-10-CM

## 2018-02-12 DIAGNOSIS — M7741 Metatarsalgia, right foot: Secondary | ICD-10-CM | POA: Insufficient documentation

## 2018-02-12 DIAGNOSIS — Z800 Family history of malignant neoplasm of digestive organs: Secondary | ICD-10-CM

## 2018-02-12 DIAGNOSIS — M7742 Metatarsalgia, left foot: Secondary | ICD-10-CM

## 2018-02-12 DIAGNOSIS — K219 Gastro-esophageal reflux disease without esophagitis: Secondary | ICD-10-CM

## 2018-02-12 LAB — COMPREHENSIVE METABOLIC PANEL
ALK PHOS: 91 U/L (ref 39–117)
ALT: 19 U/L (ref 0–35)
AST: 17 U/L (ref 0–37)
Albumin: 4.3 g/dL (ref 3.5–5.2)
BUN: 17 mg/dL (ref 6–23)
CHLORIDE: 112 meq/L (ref 96–112)
CO2: 24 mEq/L (ref 19–32)
CREATININE: 0.92 mg/dL (ref 0.40–1.20)
Calcium: 9.6 mg/dL (ref 8.4–10.5)
GFR: 77.91 mL/min (ref >60.00)
GLUCOSE: 105 mg/dL — AB (ref 70–99)
Potassium: 4 mEq/L (ref 3.5–5.1)
Sodium: 143 mEq/L (ref 135–145)
TOTAL PROTEIN: 6.2 g/dL (ref 6.0–8.3)
Total Bilirubin: 0.2 mg/dL (ref 0.2–1.2)

## 2018-02-12 LAB — CBC WITH DIFFERENTIAL/PLATELET
Basophils Absolute: 0 10*3/uL (ref 0.0–0.1)
Basophils Relative: 0.2 % (ref 0.0–3.0)
EOS ABS: 0.1 10*3/uL (ref 0.0–0.7)
Eosinophils Relative: 2.2 % (ref 0.0–5.0)
HCT: 41.1 % (ref 36.0–46.0)
Hemoglobin: 13.7 g/dL (ref 12.0–15.0)
Lymphocytes Relative: 42.9 % (ref 12.0–46.0)
Lymphs Abs: 2.8 10*3/uL (ref 0.7–4.0)
MCHC: 33.3 g/dL (ref 30.0–36.0)
MCV: 96.7 fl (ref 78.0–100.0)
MONO ABS: 0.5 10*3/uL (ref 0.1–1.0)
Monocytes Relative: 8 % (ref 3.0–12.0)
NEUTROS PCT: 46.7 % (ref 43.0–77.0)
Neutro Abs: 3.1 10*3/uL (ref 1.4–7.7)
Platelets: 233 10*3/uL (ref 150.0–400.0)
RBC: 4.25 Mil/uL (ref 3.87–5.11)
RDW: 13.5 % (ref 11.5–15.5)
WBC: 6.6 10*3/uL (ref 4.0–10.5)

## 2018-02-12 LAB — LIPID PANEL
CHOL/HDL RATIO: 3
CHOLESTEROL: 157 mg/dL (ref 0–200)
HDL: 46.5 mg/dL (ref >39.00)
LDL CALC: 94 mg/dL (ref 0–99)
NONHDL: 110.74
Triglycerides: 82 mg/dL (ref 0.0–149.0)
VLDL: 16.4 mg/dL (ref 0.0–40.0)

## 2018-02-12 NOTE — Patient Instructions (Signed)
Please return in 12 months for your annual complete physical; please come fasting.  Please schedule your mammogram for this month and have them send me the report.  Please schedule an eye exam; we recommend this yearly.   If you have any questions or concerns, please don't hesitate to send me a message via MyChart or call the office at [phone]. Thank you for visiting with [place] today! It's our pleasure caring for you.  Please do these things to maintain good health!   Exercise at least 30-45 minutes a day,  4-5 days a week.   Eat a low-fat diet with lots of fruits and vegetables, up to 7-9 servings per day.  Drink plenty of water daily. Try to drink 8 8oz glasses per day.  Seatbelts can save your life. Always wear your seatbelt.  Place Smoke Detectors on every level of your home and check batteries every year.  Schedule an appointment with an eye doctor for an eye exam every 1-2 years  Safe sex - use condoms to protect yourself from STDs if you could be exposed to these types of infections. Use birth control if you do not want to become pregnant and are sexually active.  Avoid heavy alcohol use. If you drink, keep it to less than 2 drinks/day and not every day.  [place].  Choose someone you trust that could speak for you if you became unable to speak for yourself.  Depression is common in our stressful world.If you're feeling down or losing interest in things you normally enjoy, please come in for a visit.  If anyone is threatening or hurting you, please get help. Physical or Emotional Violence is never OK.

## 2018-02-12 NOTE — Progress Notes (Signed)
Subjective  Chief Complaint  Patient presents with  . Annual Exam    Fasting  . Foot Pain    Left foot pain, located at the bottom of the foot. Started a week ago    HPI: Kelsey Grant is a 53 y.o. female who presents to [place] at [facility] today for a Female Wellness Visit. She also has the concerns and/or needs as listed above in the chief complaint. These will be addressed in addition to the Health Maintenance Visit.   Wellness Visit: annual visit with health maintenance review and exam without Pap   HM: pap, and CRC screening are up to date. Mammo due this month and pt to scheduled. Declines all immunizations.  Chronic disease f/u and/or acute problem visit: (deemed necessary to be done in addition to the wellness visit):  HLD: has been well controlled on statin.   Chronic pain managed by [facility] pain clinic now and back on TID narcotics. They stopped cymbalta and nortriptyline. Pt reports she is feeling well.   GERD is controlled.   C/o metatarsalgia: walking for exercise on cement; wears boots with little support or cushion, mild flat footed. No swelling, toe pain, heel pain or midfoot pain.   Assessment  1. Mixed hyperlipidemia   2. Chronic pain syndrome   3. Spondylosis of lumbar region without myelopathy or radiculopathy   4. Gastroesophageal reflux disease without esophagitis   5. Screening for HIV (human immunodeficiency virus)   6. FH: colon cancer   7. Metatarsalgia of both feet      Plan  Female Wellness Visit:  Age appropriate Health Maintenance and Prevention measures were discussed with patient. Included topics are cancer screening recommendations, ways to keep healthy (see AVS) including dietary and exercise recommendations, regular eye and dental care, use of seat belts, and avoidance of moderate alcohol use and tobacco use. Pt to schedule mammogram.   BMI: discussed patient's BMI and encouraged positive lifestyle  modifications to help get to or maintain a target BMI.  HM needs and immunizations were addressed and ordered. See below for orders. See HM and immunization section for updates.  Routine labs and screening tests ordered including cmp, cbc and lipids where appropriate.  Discussed recommendations regarding Vit D and calcium supplementation (see AVS)  Chronic disease management visit and/or acute problem visit:  HLD: check fasting labs today. On statin.   Chronic pain per pain clinic  Metatarsalgia: discussed appropriate shoes, insoles and change of walking surfaces with advil as needed.   GERD on PPI is controlled.   Follow up: Return in about 1 year (around 02/13/2019) for complete physical.  Orders Placed This Encounter  Procedures  . CBC with Differential/Platelet  . Comprehensive metabolic panel  . Lipid panel  . HIV Antibody (routine testing w rflx)   No orders of the defined types were placed in this encounter.     Lifestyle: Body mass index is 36.1 kg/m. Wt Readings from Last 3 Encounters:  02/12/18 197 lb 6.4 oz (89.5 kg)  11/12/17 192 lb 12.8 oz (87.5 kg)  11/08/17 170 lb (77.1 kg)    Patient Active Problem List   Diagnosis Date Noted  . Chronic pain syndrome 05/13/2017    Priority: High  . Chronic narcotic use 05/13/2017    Priority: High  . Mixed hyperlipidemia 05/13/2017    Priority: High  . Localized osteoarthrosis of left hip 05/13/2017    Priority: High  . Degenerative joint disease (DJD) of lumbar spine 05/13/2017  Priority: High  . FH: colon cancer 04/25/2011    Priority: High  . GERD (gastroesophageal reflux disease) 10/16/2011    Priority: Medium  . Esophageal stricture 04/25/2011    Priority: Medium    Egd/dil jun 2013   . Metatarsalgia of both feet 02/12/2018  . Colon cancer screening 10/16/2011    TCS JUN 2013    Health Maintenance  Topic Date Due  . HIV Screening  04/03/1982  . MAMMOGRAM  02/24/2018  . COLONOSCOPY  03/07/2021    . PAP SMEAR-Modifier  10/21/2021  . INFLUENZA VACCINE  Discontinued  . TETANUS/TDAP  Discontinued    There is no immunization history on file for this patient. We updated and reviewed the patient's past history in detail and it is documented below. Allergies: Patient has No Known Allergies. Past Medical History Patient  has a past medical history of Arthritis, Blood transfusion without reported diagnosis (1996), Chronic pain, GERD (gastroesophageal reflux disease), and Hypercholesteremia. Past Surgical History Patient  has a past surgical history that includes Esophagogastroduodenoscopy (03/19/2006); Savory dilation (06/18/2011); Colonoscopy (JUNE 2013 MAC); Tubal ligation; and Cesarean section (1986). Family History: Patient family history includes Arthritis in her mother; Cancer in her father; Colon cancer in her father; Diabetes in her brother, brother, father, and mother; GER disease in her son; Hyperlipidemia in her brother and father; Hypertension in her brother, brother, and father; Thrombosis in her mother. Social History:  Patient  reports that she has been smoking cigars. She has smoked for the past 4.00 years. She has never used smokeless tobacco. She reports current alcohol use. She reports previous drug use. Drug: Marijuana.  Review of Systems: Constitutional: negative for fever or malaise Ophthalmic: negative for photophobia, double vision or loss of vision Cardiovascular: negative for chest pain, dyspnea on exertion, or new LE swelling Respiratory: negative for SOB or persistent cough Gastrointestinal: negative for abdominal pain, change in bowel habits or melena Genitourinary: negative for dysuria or gross hematuria, no abnormal uterine bleeding or disharge Musculoskeletal: negative for new gait disturbance or muscular weakness Integumentary: negative for new or persistent rashes, no breast lumps Neurological: negative for TIA or stroke symptoms Psychiatric: negative for  SI or delusions Allergic/Immunologic: negative for hives  Patient Care Team    Relationship Specialty Notifications Start End  Leamon Arnt, MD PCP - General Family Medicine  05/13/17   Danie Binder, MD  Gastroenterology  04/24/11   Florian Buff, MD Consulting Physician Obstetrics and Gynecology  05/13/17   Libby Maw, MD Consulting Physician Family Medicine  08/11/17     Objective  Vitals: BP 134/82   Pulse 85   Temp 98.3 F (36.8 C) (Oral)   Resp 16   Ht _0  (1.575 m)   Wt 197 lb 6.4 oz (89.5 kg)   SpO2 99%   BMI 36.10 kg/m  General:  Well developed, well nourished, no acute distress  Psych:  Alert and orientedx3,normal mood and affect HEENT:  Normocephalic, atraumatic, non-icteric sclera, PERRL, oropharynx is clear without mass or exudate, supple neck without adenopathy, mass or thyromegaly Cardiovascular:  Normal S1, S2, RRR without gallop, rub or murmur, nondisplaced PMI Respiratory:  Good breath sounds bilaterally, CTAB with normal respiratory effort Gastrointestinal: normal bowel sounds, soft, non-tender, no noted masses. No HSM MSK: no deformities, contusions. Joints are without erythema or swelling. Spine and CVA region are nontender Skin:  Warm, no rashes or suspicious lesions noted Neurologic:    Mental status is normal. CN 2-11 are normal.  Gross motor and sensory exams are normal. Normal gait. No tremor Breast Exam: pt declines exam today.    Commons side effects, risks, benefits, and alternatives for medications and treatment plan prescribed today were discussed, and the patient expressed understanding of the given instructions. Patient is instructed to call or message via MyChart if he/she has any questions or concerns regarding our treatment plan. No barriers to understanding were identified. We discussed Red Flag symptoms and signs in detail. Patient expressed understanding regarding what to do in case of urgent or emergency type symptoms.    Medication list was reconciled, printed and provided to the patient in AVS. Patient instructions and summary information was reviewed with the patient as documented in the AVS. This note was prepared with assistance of Dragon voice recognition software. Occasional wrong-word or sound-a-like substitutions may have occurred due to the inherent limitations of voice recognition software

## 2018-02-13 ENCOUNTER — Encounter: Payer: Self-pay | Admitting: *Deleted

## 2018-02-13 LAB — HIV ANTIBODY (ROUTINE TESTING W REFLEX): HIV: NONREACTIVE

## 2018-02-13 NOTE — Progress Notes (Signed)
Please call patient: I have reviewed his/her lab results. Most everything looks great. Continue current medications. Her sugar test is just above normal so I recommend decreasing sugar like sweetened beverages, desserts, candies etc.  Can send copy of results if she'd like. Thanks!

## 2018-02-26 ENCOUNTER — Other Ambulatory Visit (HOSPITAL_COMMUNITY): Payer: Self-pay | Admitting: Family Medicine

## 2018-02-26 DIAGNOSIS — Z1231 Encounter for screening mammogram for malignant neoplasm of breast: Secondary | ICD-10-CM

## 2018-03-06 ENCOUNTER — Ambulatory Visit (HOSPITAL_COMMUNITY)
Admission: RE | Admit: 2018-03-06 | Discharge: 2018-03-06 | Disposition: A | Discharge status: Home / Self Care | Payer: Medicare Other | Source: Ambulatory Visit | Attending: Family Medicine | Admitting: Family Medicine

## 2018-03-06 DIAGNOSIS — Z1231 Encounter for screening mammogram for malignant neoplasm of breast: Secondary | ICD-10-CM | POA: Diagnosis not present

## 2018-03-20 ENCOUNTER — Ambulatory Visit (INDEPENDENT_AMBULATORY_CARE_PROVIDER_SITE_OTHER): Payer: Medicare Other | Admitting: Obstetrics & Gynecology

## 2018-03-20 ENCOUNTER — Other Ambulatory Visit: Payer: Self-pay

## 2018-03-20 ENCOUNTER — Encounter: Payer: Self-pay | Admitting: Obstetrics & Gynecology

## 2018-03-20 VITALS — BP 143/91 | HR 84 | Ht 62.0 in | Wt 196.0 lb

## 2018-03-20 DIAGNOSIS — Z1211 Encounter for screening for malignant neoplasm of colon: Secondary | ICD-10-CM

## 2018-03-20 DIAGNOSIS — B3731 Acute candidiasis of vulva and vagina: Secondary | ICD-10-CM

## 2018-03-20 DIAGNOSIS — Z01419 Encounter for gynecological examination (general) (routine) without abnormal findings: Secondary | ICD-10-CM | POA: Diagnosis not present

## 2018-03-20 DIAGNOSIS — B373 Candidiasis of vulva and vagina: Secondary | ICD-10-CM

## 2018-03-20 DIAGNOSIS — N912 Amenorrhea, unspecified: Secondary | ICD-10-CM | POA: Diagnosis not present

## 2018-03-20 LAB — HEMOCCULT GUIAC POC 1CARD (OFFICE): Fecal Occult Blood, POC: NEGATIVE

## 2018-03-20 MED ORDER — TERCONAZOLE 0.4 % VA CREA: 1.0000 | TOPICAL_CREAM | Freq: Every day | VAGINAL | 0 refills | Status: DC

## 2018-03-20 NOTE — Addendum Note (Signed)
Addended by: Gaylyn Rong A on: 03/20/2018 09:32 AM   Modules accepted: Orders
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
## 2018-09-10 ENCOUNTER — Other Ambulatory Visit: Payer: Self-pay | Admitting: Obstetrics & Gynecology

## 2018-09-16 ENCOUNTER — Other Ambulatory Visit: Payer: Self-pay | Admitting: Family Medicine

## 2018-09-16 MED ORDER — OMEPRAZOLE 20 MG PO CPDR: 20.0000 mg | DELAYED_RELEASE_CAPSULE | Freq: Every day | ORAL | 3 refills | Status: DC

## 2018-09-16 MED ORDER — PRAVASTATIN SODIUM 40 MG PO TABS: 40.0000 mg | ORAL_TABLET | Freq: Every day | ORAL | 3 refills | Status: DC

## 2018-09-16 NOTE — Telephone Encounter (Signed)
Medication: pravastatin (PRAVACHOL) 40 MG tablet [id number]   omeprazole (PRILOSEC) 20 MG capsule [id number]    Pharmacy:  [place], [place] [phone] (Phone) [phone] (Fax)

## 2019-02-12 ENCOUNTER — Telehealth: Payer: Self-pay | Admitting: Family Medicine

## 2019-02-12 NOTE — Telephone Encounter (Signed)
I left a message asking the patient to call and schedule Medicare AWV with Loma Sousa ([employer]).  If patient calls back, please schedule Medicare Wellness Visit (initial) at next available opening, possibly as a virtual visit on Monday 02/15/19. VDM (Dee-Dee)

## 2019-02-15 ENCOUNTER — Other Ambulatory Visit: Payer: Self-pay

## 2019-02-16 ENCOUNTER — Encounter (INDEPENDENT_AMBULATORY_CARE_PROVIDER_SITE_OTHER): Payer: Self-pay

## 2019-02-16 ENCOUNTER — Encounter: Payer: Medicare Other | Admitting: Family Medicine

## 2019-02-16 ENCOUNTER — Ambulatory Visit (INDEPENDENT_AMBULATORY_CARE_PROVIDER_SITE_OTHER): Payer: Medicare Other

## 2019-02-16 DIAGNOSIS — Z0000 Encounter for general adult medical examination without abnormal findings: Secondary | ICD-10-CM
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
## 2019-02-16 NOTE — Patient Instructions (Signed)
Kelsey Grant , Thank you for taking time to come for your Medicare Wellness Visit. I appreciate your ongoing commitment to your health goals. Please review the following plan we discussed and let me know if I can assist you in the future.   Screening recommendations/referrals: Colorectal Screening: up to date; last colonoscopy 03/08/11 (repeat in 2023) Mammogram: up to date; see attached information to schedule for this year Bone Density: up to date   Vision and Dental Exams: Recommended annual ophthalmology exams for early detection of glaucoma and other disorders of the eye Recommended annual dental exams for proper oral hygiene  Vaccinations: (see attached information on the importance of vaccines)  Influenza vaccine: recommended Pneumococcal vaccine: recommended  Tdap vaccine: recommended  Shingles vaccine: Please call your insurance company to determine your out of pocket expense for the Shingrix vaccine. You may receive this vaccine at your local pharmacy.  Advanced directives: Advance directives discussed with you today. I have provided a copy for you to complete at home and have notarized. Once this is complete please bring a copy in to our office so we can scan it into your chart.  Goals: Recommend to drink at least 6-8 8oz glasses of water per day and consume a balanced diet rich in fresh fruits and vegetables.   Next appointment: Please schedule your Annual Wellness Visit with your Nurse Health Advisor in one year.  Preventive Care 40-64 Years, Female Preventive care refers to lifestyle choices and visits with your health care provider that can promote health and wellness. What does preventive care include?  A yearly physical exam. This is also called an annual well check.  Dental exams once or twice a year.  Routine eye exams. Ask your health care provider how often you should have your eyes checked.  Personal lifestyle choices, including:  Daily care of your teeth and  gums.  Regular physical activity.  Eating a healthy diet.  Avoiding tobacco and drug use.  Limiting alcohol use.  Practicing safe sex.  Taking low-dose aspirin daily starting at age 50 if recommended by your health care provider.  Taking vitamin and mineral supplements as recommended by your health care provider. What happens during an annual well check? The services and screenings done by your health care provider during your annual well check will depend on your age, overall health, lifestyle risk factors, and family history of disease. Counseling  Your health care provider may ask you questions about your:  Alcohol use.  Tobacco use.  Drug use.  Emotional well-being.  Home and relationship well-being.  Sexual activity.  Eating habits.  Work and work environment.  Method of birth control.  Menstrual cycle.  Pregnancy history. Screening  You may have the following tests or measurements:  Height, weight, and BMI.  Blood pressure.  Lipid and cholesterol levels. These may be checked every 5 years, or more frequently if you are over 50 years old.  Skin check.  Lung cancer screening. You may have this screening every year starting at age 55 if you have a 30-pack-year history of smoking and currently smoke or have quit within the past 15 years.  Fecal occult blood test (FOBT) of the stool. You may have this test every year starting at age 50.  Flexible sigmoidoscopy or colonoscopy. You may have a sigmoidoscopy every 5 years or a colonoscopy every 10 years starting at age 50.  Hepatitis C blood test.  Hepatitis B blood test.  Sexually transmitted disease (STD) testing.  Diabetes screening.   This is done by checking your blood sugar (glucose) after you have not eaten for a while (fasting). You may have this done every 1-3 years.  Mammogram. This may be done every 1-2 years. Talk to your health care provider about when you should start having regular  mammograms. This may depend on whether you have a family history of breast cancer.  BRCA-related cancer screening. This may be done if you have a family history of breast, ovarian, tubal, or peritoneal cancers.  Pelvic exam and Pap test. This may be done every 3 years starting at age 63. Starting at age 42, this may be done every 5 years if you have a Pap test in combination with an HPV test.  Bone density scan. This is done to screen for osteoporosis. You may have this scan if you are at high risk for osteoporosis. Discuss your test results, treatment options, and if necessary, the need for more tests with your health care provider. Vaccines  Your health care provider may recommend certain vaccines, such as:  Influenza vaccine. This is recommended every year.  Tetanus, diphtheria, and acellular pertussis (Tdap, Td) vaccine. You may need a Td booster every 10 years.  Zoster vaccine. You may need this after age 19.  Pneumococcal 13-valent conjugate (PCV13) vaccine. You may need this if you have certain conditions and were not previously vaccinated.  Pneumococcal polysaccharide (PPSV23) vaccine. You may need one or two doses if you smoke cigarettes or if you have certain conditions. Talk to your health care provider about which screenings and vaccines you need and how often you need them. This information is not intended to replace advice given to you by your health care provider. Make sure you discuss any questions you have with your health care provider. Document Released: 01/20/2015 Document Revised: 09/13/2015 Document Reviewed: 10/25/2014 Elsevier Interactive Patient Education  2017 [place] Prevention in the Home Falls can cause injuries. They can happen to people of all ages. There are many things you can do to make your home safe and to help prevent falls. What can I do on the outside of my home?  Regularly fix the edges of walkways and driveways and fix any  cracks.  Remove anything that might make you trip as you walk through a door, such as a raised step or threshold.  Trim any bushes or trees on the path to your home.  Use bright outdoor lighting.  Clear any walking paths of anything that might make someone trip, such as rocks or tools.  Regularly check to see if handrails are loose or broken. Make sure that both sides of any steps have handrails.  Any raised decks and porches should have guardrails on the edges.  Have any leaves, snow, or ice cleared regularly.  Use sand or salt on walking paths during winter.  Clean up any spills in your garage right away. This includes oil or grease spills. What can I do in the bathroom?  Use night lights.  Install grab bars by the toilet and in the tub and shower. Do not use towel bars as grab bars.  Use non-skid mats or decals in the tub or shower.  If you need to sit down in the shower, use a plastic, non-slip stool.  Keep the floor dry. Clean up any water that spills on the floor as soon as it happens.  Remove soap buildup in the tub or shower regularly.  Attach bath mats securely with double-sided  non-slip rug tape.  Do not have throw rugs and other things on the floor that can make you trip. What can I do in the bedroom?  Use night lights.  Make sure that you have a light by your bed that is easy to reach.  Do not use any sheets or blankets that are too big for your bed. They should not hang down onto the floor.  Have a firm chair that has side arms. You can use this for support while you get dressed.  Do not have throw rugs and other things on the floor that can make you trip. What can I do in the kitchen?  Clean up any spills right away.  Avoid walking on wet floors.  Keep items that you use a lot in easy-to-reach places.  If you need to reach something above you, use a strong step stool that has a grab bar.  Keep electrical cords out of the way.  Do not use floor  polish or wax that makes floors slippery. If you must use wax, use non-skid floor wax.  Do not have throw rugs and other things on the floor that can make you trip. What can I do with my stairs?  Do not leave any items on the stairs.  Make sure that there are handrails on both sides of the stairs and use them. Fix handrails that are broken or loose. Make sure that handrails are as long as the stairways.  Check any carpeting to make sure that it is firmly attached to the stairs. Fix any carpet that is loose or worn.  Avoid having throw rugs at the top or bottom of the stairs. If you do have throw rugs, attach them to the floor with carpet tape.  Make sure that you have a light switch at the top of the stairs and the bottom of the stairs. If you do not have them, ask someone to add them for you. What else can I do to help prevent falls?  Wear shoes that:  Do not have high heels.  Have rubber bottoms.  Are comfortable and fit you well.  Are closed at the toe. Do not wear sandals.  If you use a stepladder:  Make sure that it is fully opened. Do not climb a closed stepladder.  Make sure that both sides of the stepladder are locked into place.  Ask someone to hold it for you, if possible.  Clearly mark and make sure that you can see:  Any grab bars or handrails.  First and last steps.  Where the edge of each step is.  Use tools that help you move around (mobility aids) if they are needed. These include:  Canes.  Walkers.  Scooters.  Crutches.  Turn on the lights when you go into a dark area. Replace any light bulbs as soon as they burn out.  Set up your furniture so you have a clear path. Avoid moving your furniture around.  If any of your floors are uneven, fix them.  If there are any pets around you, be aware of where they are.  Review your medicines with your doctor. Some medicines can make you feel dizzy. This can increase your chance of falling. Ask your  doctor what other things that you can do to help prevent falls. This information is not intended to replace advice given to you by your health care provider. Make sure you discuss any questions you have with your health care provider. Document   Released: 10/20/2008 Document Revised: 06/01/2015 Document Reviewed: 01/28/2014 Elsevier Interactive Patient Education  2017 [employer].

## 2019-02-17 ENCOUNTER — Encounter: Payer: Self-pay | Admitting: Family Medicine

## 2019-02-17 ENCOUNTER — Ambulatory Visit (INDEPENDENT_AMBULATORY_CARE_PROVIDER_SITE_OTHER): Payer: Medicare Other | Admitting: Family Medicine

## 2019-02-17 ENCOUNTER — Other Ambulatory Visit: Payer: Self-pay

## 2019-02-17 VITALS — BP 160/90 | HR 80 | Temp 98.0°F | Ht 62.0 in | Wt 201.8 lb

## 2019-02-17 DIAGNOSIS — Z800 Family history of malignant neoplasm of digestive organs: Secondary | ICD-10-CM

## 2019-02-17 DIAGNOSIS — K219 Gastro-esophageal reflux disease without esophagitis: Secondary | ICD-10-CM

## 2019-02-17 DIAGNOSIS — G894 Chronic pain syndrome: Secondary | ICD-10-CM

## 2019-02-17 DIAGNOSIS — R030 Elevated blood-pressure reading, without diagnosis of hypertension: Secondary | ICD-10-CM

## 2019-02-17 DIAGNOSIS — M1612 Unilateral primary osteoarthritis, left hip: Secondary | ICD-10-CM | POA: Diagnosis not present

## 2019-02-17 DIAGNOSIS — E669 Obesity, unspecified: Secondary | ICD-10-CM

## 2019-02-17 DIAGNOSIS — M47816 Spondylosis without myelopathy or radiculopathy, lumbar region: Secondary | ICD-10-CM

## 2019-02-17 DIAGNOSIS — F1190 Opioid use, unspecified, uncomplicated: Secondary | ICD-10-CM

## 2019-02-17 DIAGNOSIS — E782 Mixed hyperlipidemia: Secondary | ICD-10-CM

## 2019-02-17 NOTE — Patient Instructions (Signed)
Please return in 6 weeks if your blood pressures are high; 6 months if they are < 140/90 at home.   We will call you with your lab results.  Eat a low salt, low fat diet.   If you have any questions or concerns, please don't hesitate to send me a message via MyChart or call the office at [phone]. Thank you for visiting with [place] today! It's our pleasure caring for you.   DASH Eating Plan DASH stands for "Dietary Approaches to Stop Hypertension." The DASH eating plan is a healthy eating plan that has been shown to reduce high blood pressure (hypertension). It may also reduce your risk for type 2 diabetes, heart disease, and stroke. The DASH eating plan may also help with weight loss. What are tips for following this plan?  General guidelines  Avoid eating more than 2,300 mg (milligrams) of salt (sodium) a day. If you have hypertension, you may need to reduce your sodium intake to 1,500 mg a day.  Limit alcohol intake to no more than 1 drink a day for nonpregnant women and 2 drinks a day for men. One drink equals 12 oz of beer, 5 oz of wine, or 1 oz of hard liquor.  Work with your health care provider to maintain a healthy body weight or to lose weight. Ask what an ideal weight is for you.  Get at least 30 minutes of exercise that causes your heart to beat faster (aerobic exercise) most days of the week. Activities may include walking, swimming, or biking.  Work with your health care provider or diet and nutrition specialist (dietitian) to adjust your eating plan to your individual calorie needs. Reading food labels   Check food labels for the amount of sodium per serving. Choose foods with less than 5 percent of the Daily Value of sodium. Generally, foods with less than 300 mg of sodium per serving fit into this eating plan.  To find whole grains, look for the word "whole" as the first word in the ingredient list. Shopping  Buy products labeled as "low-sodium" or "no salt  added."  Buy fresh foods. Avoid canned foods and premade or frozen meals. Cooking  Avoid adding salt when cooking. Use salt-free seasonings or herbs instead of table salt or sea salt. Check with your health care provider or pharmacist before using salt substitutes.  Do not fry foods. Cook foods using healthy methods such as baking, boiling, grilling, and broiling instead.  Cook with heart-healthy oils, such as olive, canola, soybean, or sunflower oil. Meal planning  Eat a balanced diet that includes: ? 5 or more servings of fruits and vegetables each day. At each meal, try to fill half of your plate with fruits and vegetables. ? Up to 6-8 servings of whole grains each day. ? Less than 6 oz of lean meat, poultry, or fish each day. A 3-oz serving of meat is about the same size as a deck of cards. One egg equals 1 oz. ? 2 servings of low-fat dairy each day. ? A serving of nuts, seeds, or beans 5 times each week. ? Heart-healthy fats. Healthy fats called Omega-3 fatty acids are found in foods such as flaxseeds and coldwater fish, like sardines, salmon, and mackerel.  Limit how much you eat of the following: ? Canned or prepackaged foods. ? Food that is high in trans fat, such as fried foods. ? Food that is high in saturated fat, such as fatty meat. ? Sweets, desserts, sugary drinks,  and other foods with added sugar. ? Full-fat dairy products.  Do not salt foods before eating.  Try to eat at least 2 vegetarian meals each week.  Eat more home-cooked food and less restaurant, buffet, and fast food.  When eating at a restaurant, ask that your food be prepared with less salt or no salt, if possible. What foods are recommended? The items listed may not be a complete list. Talk with your dietitian about what dietary choices are best for you. Grains Whole-grain or whole-wheat bread. Whole-grain or whole-wheat pasta. Brown rice. Kelsey Grant. Bulgur. Whole-grain and low-sodium cereals.  Pita bread. Low-fat, low-sodium crackers. Whole-wheat flour tortillas. Vegetables Fresh or frozen vegetables (raw, steamed, roasted, or grilled). Low-sodium or reduced-sodium tomato and vegetable juice. Low-sodium or reduced-sodium tomato sauce and tomato paste. Low-sodium or reduced-sodium canned vegetables. Fruits All fresh, dried, or frozen fruit. Canned fruit in natural juice (without added sugar). Meat and other protein foods Skinless chicken or [place]. Ground chicken or [place]. Pork with fat trimmed off. Fish and seafood. Egg whites. Dried beans, peas, or lentils. Unsalted nuts, nut butters, and seeds. Unsalted canned beans. Lean cuts of beef with fat trimmed off. Low-sodium, lean deli meat. Dairy Low-fat (1%) or fat-free (skim) milk. Fat-free, low-fat, or reduced-fat cheeses. Nonfat, low-sodium ricotta or cottage cheese. Low-fat or nonfat yogurt. Low-fat, low-sodium cheese. Fats and oils Soft margarine without trans fats. Vegetable oil. Low-fat, reduced-fat, or light mayonnaise and salad dressings (reduced-sodium). Canola, safflower, olive, soybean, and sunflower oils. Avocado. Seasoning and other foods Herbs. Spices. Seasoning mixes without salt. Unsalted popcorn and pretzels. Fat-free sweets. What foods are not recommended? The items listed may not be a complete list. Talk with your dietitian about what dietary choices are best for you. Grains Baked goods made with fat, such as croissants, muffins, or some breads. Dry pasta or rice meal packs. Vegetables Creamed or fried vegetables. Vegetables in a cheese sauce. Regular canned vegetables (not low-sodium or reduced-sodium). Regular canned tomato sauce and paste (not low-sodium or reduced-sodium). Regular tomato and vegetable juice (not low-sodium or reduced-sodium). Kelsey Grant. Olives. Fruits Canned fruit in a light or heavy syrup. Fried fruit. Fruit in cream or butter sauce. Meat and other protein foods Fatty cuts of meat. Ribs. Fried  meat. Kelsey Grant. Sausage. Bologna and other processed lunch meats. Salami. Fatback. Hotdogs. Bratwurst. Salted nuts and seeds. Canned beans with added salt. Canned or smoked fish. Whole eggs or egg yolks. Chicken or [place] with skin. Dairy Whole or 2% milk, cream, and half-and-half. Whole or full-fat cream cheese. Whole-fat or sweetened yogurt. Full-fat cheese. Nondairy creamers. Whipped toppings. Processed cheese and cheese spreads. Fats and oils Butter. Stick margarine. Lard. Shortening. Ghee. Bacon fat. Tropical oils, such as coconut, palm kernel, or palm oil. Seasoning and other foods Salted popcorn and pretzels. Onion salt, garlic salt, seasoned salt, table salt, and sea salt. Worcestershire sauce. Tartar sauce. Barbecue sauce. Teriyaki sauce. Soy sauce, including reduced-sodium. Steak sauce. Canned and packaged gravies. Fish sauce. Oyster sauce. Cocktail sauce. Horseradish that you find on the shelf. Ketchup. Mustard. Meat flavorings and tenderizers. Bouillon cubes. Hot sauce and Tabasco sauce. Premade or packaged marinades. Premade or packaged taco seasonings. Relishes. Regular salad dressings. Where to find more information:  National Heart, Lung, and [place]: https://wilson-eaton.com/  American Heart Association: www.heart.org Summary  The DASH eating plan is a healthy eating plan that has been shown to reduce high blood pressure (hypertension). It may also reduce your risk for type 2 diabetes, heart disease, and stroke.  With the DASH eating plan, you should limit salt (sodium) intake to 2,300 mg a day. If you have hypertension, you may need to reduce your sodium intake to 1,500 mg a day.  When on the DASH eating plan, aim to eat more fresh fruits and vegetables, whole grains, lean proteins, low-fat dairy, and heart-healthy fats.  Work with your health care provider or diet and nutrition specialist (dietitian) to adjust your eating plan to your individual calorie needs. This information is  not intended to replace advice given to you by your health care provider. Make sure you discuss any questions you have with your health care provider. Document Revised: 12/06/2016 Document Reviewed: 12/18/2015 Elsevier Patient Education  2020 [employer].

## 2019-02-17 NOTE — Progress Notes (Signed)
Subjective  Chief Complaint  Patient presents with  . Annual Exam    Not fasting.   Marland Kitchen Hyperlipidemia    Takes pravastatin daily. Walks daily. Diet is healthy  . Back Pain    takes Tramadol prn for pain    HPI: Kelsey Grant is a 54 y.o. female who presents to [facility] [place] [facility] today for a Female Wellness Visit. She also has the concerns and/or needs as listed above in the chief complaint. These will be addressed in addition to the Health Maintenance Visit.   Wellness Visit: annual visit with health maintenance review and exam without Pap   HM: screens up to date with mammo due now has appt at [facility] set up. Sees GYN and due for exam. On megace for menorrhagia now resolved on meds.  Refuses imms. Chronic disease f/u and/or acute problem visit: (deemed necessary to be done in addition to the wellness visit):  Chronic pain per pain clinic. Reports uncontrolled. On chronic narcotics  HLD on statin. Recheck. Doing fine.   Gerd: well controlled on ppi.   Obesity: weight is up a few pounds. Diet is poor. Limited exercise. However, mood is good!  Assessment  1. Mixed hyperlipidemia   2. Localized osteoarthrosis of left hip   3. Spondylosis of lumbar region without myelopathy or radiculopathy   4. Chronic pain syndrome   5. Chronic narcotic use   6. FH: colon cancer   7. Gastroesophageal reflux disease without esophagitis   8. Obesity (BMI 30-39.9)   9. Elevated blood pressure reading without diagnosis of hypertension      Plan  Female Wellness Visit:  Age appropriate Health Maintenance and Prevention measures were discussed with patient. Included topics are cancer screening recommendations, ways to keep healthy (see AVS) including dietary and exercise recommendations, regular eye and dental care, use of seat belts, and avoidance of moderate alcohol use and tobacco use. mammo scheduled. Due gyn exam/f/u  BMI: discussed patient's BMI and encouraged  positive lifestyle modifications to help get to or maintain a target BMI.  HM needs and immunizations were addressed and ordered. See below for orders. See HM and immunization section for updates. Refuses all imss  Routine labs and screening tests ordered including cmp, cbc and lipids where appropriate.  Discussed recommendations regarding Vit D and calcium supplementation (see AVS)  Chronic disease management visit and/or acute problem visit:  Chronic pain per [facility].   gerd is controlled on chronic ppi  rec weight loss and better diet  HLD: recheck nonfasting labs today.   Elevated blood pressure: concerning for HTN;pt believes it is diet related since had cook out before coming in today. rec home readings and f/u in 6 weeks. To start meds at that time if persists. rec low fat low sodium diet andweight loss. See avs.   Follow up: 6 weeks to recheck bp.  03/31/2019   Orders Placed This Encounter  Procedures  . Comprehensive metabolic panel  . CBC with Differential/Platelet  . Lipid panel  . TSH   No orders of the defined types were placed in this encounter.     Lifestyle: Body mass index is 36.91 kg/m. Wt Readings from Last 3 Encounters:  02/17/19 201 lb 12.8 oz (91.5 kg)  03/20/18 196 lb (88.9 kg)  02/12/18 197 lb 6.4 oz (89.5 kg)     Patient Active Problem List   Diagnosis Date Noted  . Chronic pain syndrome 05/13/2017    Priority:  High  . Chronic narcotic use 05/13/2017    Priority: High  . Mixed hyperlipidemia 05/13/2017    Priority: High  . Localized osteoarthrosis of left hip 05/13/2017    Priority: High  . 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 FH: colon cancer 04/25/2011    Priority: High  . GERD (gastroesophageal reflux disease) 10/16/2011    Priority: Medium  . Esophageal stricture 04/25/2011    Priority: Medium    Egd/dil jun 2013   . Obesity (BMI 30-39.9) 02/17/2019  . Metatarsalgia of  both feet 02/12/2018  . Colon cancer screening 10/16/2011    TCS JUN 2013    Health Maintenance  Topic Date Due  . MAMMOGRAM  03/07/2019  . COLONOSCOPY  03/07/2021  . PAP SMEAR-Modifier  10/21/2021  . HIV Screening  Completed  . INFLUENZA VACCINE  Discontinued  . TETANUS/TDAP  Discontinued    There is no immunization history on file for this patient. We updated and reviewed the patient's past history in detail and it is documented below. Allergies: Patient has No Known Allergies. Past Medical History Patient  has a past medical history of Arthritis, Blood transfusion without reported diagnosis (1996), Chronic pain, GERD (gastroesophageal reflux disease), and Hypercholesteremia. Past Surgical History Patient  has a past surgical history that includes Esophagogastroduodenoscopy (03/19/2006); Savory dilation (06/18/2011); Colonoscopy (JUNE 2013 MAC); Tubal ligation; and Cesarean section (1986). Family History: Patient family history includes Arthritis in her mother; Cancer in her father; Colon cancer in her father; Diabetes in her brother, brother, father, and mother; GER disease in her son; Hyperlipidemia in her brother and father; Hypertension in her brother, brother, and father; Thrombosis in her mother. Social History:  Patient  reports that she has been smoking cigars. She has smoked for the past 4.00 years. She has never used smokeless tobacco. She reports current alcohol use. She reports current drug use. Drug: Marijuana.  Review of Systems: Constitutional: negative for fever or malaise Ophthalmic: negative for photophobia, double vision or loss of vision Cardiovascular: negative for chest pain, dyspnea on exertion, or new LE swelling Respiratory: negative for SOB or persistent cough Gastrointestinal: negative for abdominal pain, change in bowel habits or melena Genitourinary: negative for dysuria or gross hematuria, no abnormal uterine bleeding or disharge Musculoskeletal:  negative for new gait disturbance or muscular weakness Integumentary: negative for new or persistent rashes, no breast lumps Neurological: negative for TIA or stroke symptoms Psychiatric: negative for SI or delusions Allergic/Immunologic: negative for hives  Patient Care Team    Relationship Specialty Notifications Start End  Leamon Arnt, MD PCP - General Family Medicine  05/13/17   Danie Binder, MD  Gastroenterology  04/24/11   Florian Buff, MD Consulting Physician Obstetrics and Gynecology  05/13/17     Objective  Vitals: BP (!) 160/90   Pulse 80   Temp 98 F (36.7 C) (Temporal)   Ht _0  (1.575 m)   Wt 201 lb 12.8 oz (91.5 kg)   SpO2 99%   BMI 36.91 kg/m  General:  Well developed, well nourished, no acute distress  Psych:  Alert and orientedx3,normal mood and affect HEENT:  Normocephalic, atraumatic, non-icteric sclera, PERRL,  supple neck without adenopathy, mass or thyromegaly Cardiovascular:  Normal S1, S2, RRR without gallop, rub or murmur Respiratory:  Good breath sounds bilaterally, CTAB with normal respiratory effort Gastrointestinal: normal bowel sounds, soft, non-tender, no noted masses. No HSM MSK: no deformities, contusions. Joints are without  erythema or swelling. Spine and CVA region are nontender Skin:  Warm, no rashes or suspicious lesions noted Neurologic:    Mental status is normal. CN 2-11 are normal. Gross motor and sensory exams are normal. Normal gait. No tremor Breast Exam: No mass, skin retraction or nipple discharge is appreciated in either breast. No axillary adenopathy. Fibrocystic changes are not noted    Commons side effects, risks, benefits, and alternatives for medications and treatment plan prescribed today were discussed, and the patient expressed understanding of the given instructions. Patient is instructed to call or message via MyChart if he/she has any questions or concerns regarding our treatment plan. No barriers to understanding were  identified. We discussed Red Flag symptoms and signs in detail. Patient expressed understanding regarding what to do in case of urgent or emergency type symptoms.   Medication list was reconciled, printed and provided to the patient in AVS. Patient instructions and summary information was reviewed with the patient as documented in the AVS. This note was prepared with assistance of Dragon voice recognition software. Occasional wrong-word or sound-a-like substitutions may have occurred due to the inherent limitations of voice recognition software  This visit occurred during the SARS-CoV-2 public health emergency.  Safety protocols were in place, including screening questions prior to the visit, additional usage of staff PPE, and extensive cleaning of exam room while observing appropriate contact time as indicated for disinfecting solutions.

## 2019-02-18 ENCOUNTER — Other Ambulatory Visit (HOSPITAL_COMMUNITY): Payer: Self-pay | Admitting: Family Medicine

## 2019-02-18 DIAGNOSIS — Z1231 Encounter for screening mammogram for malignant neoplasm of breast: Secondary | ICD-10-CM

## 2019-02-18 LAB — CBC WITH DIFFERENTIAL/PLATELET
Basophils Absolute: 0 10*3/uL (ref 0.0–0.1)
Basophils Relative: 0.5 % (ref 0.0–3.0)
Eosinophils Absolute: 0.1 10*3/uL (ref 0.0–0.7)
Eosinophils Relative: 1.1 % (ref 0.0–5.0)
HCT: 40.5 % (ref 36.0–46.0)
Hemoglobin: 13.2 g/dL (ref 12.0–15.0)
Lymphocytes Relative: 45.3 % (ref 12.0–46.0)
Lymphs Abs: 3.1 10*3/uL (ref 0.7–4.0)
MCHC: 32.6 g/dL (ref 30.0–36.0)
MCV: 98.8 fl (ref 78.0–100.0)
Monocytes Absolute: 0.6 10*3/uL (ref 0.1–1.0)
Monocytes Relative: 8.5 % (ref 3.0–12.0)
Neutro Abs: 3.1 10*3/uL (ref 1.4–7.7)
Neutrophils Relative %: 44.6 % (ref 43.0–77.0)
Platelets: 268 10*3/uL (ref 150.0–400.0)
RBC: 4.1 Mil/uL (ref 3.87–5.11)
RDW: 14 % (ref 11.5–15.5)
WBC: 6.9 10*3/uL (ref 4.0–10.5)

## 2019-02-18 LAB — LIPID PANEL
Cholesterol: 150 mg/dL (ref 0–200)
HDL: 48.3 mg/dL (ref >39.00)
LDL Cholesterol: 71 mg/dL (ref 0–99)
NonHDL: 101.38
Total CHOL/HDL Ratio: 3
Triglycerides: 151 mg/dL — ABNORMAL HIGH (ref 0.0–149.0)
VLDL: 30.2 mg/dL (ref 0.0–40.0)

## 2019-02-18 LAB — COMPREHENSIVE METABOLIC PANEL
ALT: 15 U/L (ref 0–35)
AST: 14 U/L (ref 0–37)
Albumin: 4.3 g/dL (ref 3.5–5.2)
Alkaline Phosphatase: 90 U/L (ref 39–117)
BUN: 21 mg/dL (ref 6–23)
CO2: 24 mEq/L (ref 19–32)
Calcium: 9.3 mg/dL (ref 8.4–10.5)
Chloride: 110 mEq/L (ref 96–112)
Creatinine, Ser: 1.01 mg/dL (ref 0.40–1.20)
GFR: 69.68 mL/min (ref >60.00)
Glucose, Bld: 95 mg/dL (ref 70–99)
Potassium: 3.6 mEq/L (ref 3.5–5.1)
Sodium: 144 mEq/L (ref 135–145)
Total Bilirubin: 0.3 mg/dL (ref 0.2–1.2)
Total Protein: 6.5 g/dL (ref 6.0–8.3)

## 2019-02-18 LAB — TSH: TSH: 1.19 u[IU]/mL (ref 0.35–4.50)

## 2019-03-10 ENCOUNTER — Ambulatory Visit (HOSPITAL_COMMUNITY)
Admission: RE | Admit: 2019-03-10 | Discharge: 2019-03-10 | Disposition: A | Discharge status: Home / Self Care | Payer: Medicare Other | Source: Ambulatory Visit | Attending: Family Medicine | Admitting: Family Medicine

## 2019-03-10 ENCOUNTER — Other Ambulatory Visit: Payer: Self-pay

## 2019-03-10 DIAGNOSIS — Z1231 Encounter for screening mammogram for malignant neoplasm of breast: Secondary | ICD-10-CM | POA: Diagnosis not present

## 2019-03-22 ENCOUNTER — Encounter: Payer: Self-pay | Admitting: Obstetrics & Gynecology

## 2019-03-22 ENCOUNTER — Ambulatory Visit: Payer: Medicare Other | Admitting: Obstetrics & Gynecology

## 2019-03-22 ENCOUNTER — Ambulatory Visit (INDEPENDENT_AMBULATORY_CARE_PROVIDER_SITE_OTHER): Payer: Medicare Other | Admitting: Obstetrics & Gynecology

## 2019-03-22 ENCOUNTER — Other Ambulatory Visit: Payer: Self-pay

## 2019-03-22 VITALS — BP 144/96 | HR 79 | Ht 62.0 in | Wt 202.0 lb

## 2019-03-22 DIAGNOSIS — N951 Menopausal and female climacteric states: Secondary | ICD-10-CM | POA: Diagnosis not present

## 2019-03-22 MED ORDER — MEDROXYPROGESTERONE ACETATE 10 MG PO TABS: 10.0000 mg | ORAL_TABLET | Freq: Every day | ORAL | 0 refills | Status: DC
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
## 2019-03-31 ENCOUNTER — Ambulatory Visit: Payer: Medicare Other | Admitting: Family Medicine

## 2019-04-16 ENCOUNTER — Other Ambulatory Visit: Payer: Self-pay

## 2019-04-16 ENCOUNTER — Ambulatory Visit (INDEPENDENT_AMBULATORY_CARE_PROVIDER_SITE_OTHER): Payer: Medicare Other | Admitting: Family Medicine

## 2019-04-16 ENCOUNTER — Encounter: Payer: Self-pay | Admitting: Family Medicine

## 2019-04-16 VITALS — BP 132/80 | HR 92 | Temp 97.8°F | Resp 15 | Ht 62.0 in | Wt 204.6 lb

## 2019-04-16 DIAGNOSIS — R030 Elevated blood-pressure reading, without diagnosis of hypertension: Secondary | ICD-10-CM | POA: Diagnosis not present

## 2019-04-16 DIAGNOSIS — E669 Obesity, unspecified: Secondary | ICD-10-CM | POA: Diagnosis not present

## 2019-04-16 NOTE — Patient Instructions (Signed)
Please return in 6 months for blood pressure follow up.  Your blood pressure looks good today! Work on exercise, diet and weight loss to keep your blood pressure in the normal range and avoid the need for medication.   If you have any questions or concerns, please don't hesitate to send me a message via MyChart or call the office at [phone]. Thank you for visiting with [place] today! It's our pleasure caring for you.   DASH Eating Plan DASH stands for "Dietary Approaches to Stop Hypertension." The DASH eating plan is a healthy eating plan that has been shown to reduce high blood pressure (hypertension). It may also reduce your risk for type 2 diabetes, heart disease, and stroke. The DASH eating plan may also help with weight loss. What are tips for following this plan?  General guidelines  Avoid eating more than 2,300 mg (milligrams) of salt (sodium) a day. If you have hypertension, you may need to reduce your sodium intake to 1,500 mg a day.  Limit alcohol intake to no more than 1 drink a day for nonpregnant women and 2 drinks a day for men. One drink equals 12 oz of beer, 5 oz of wine, or 1 oz of hard liquor.  Work with your health care provider to maintain a healthy body weight or to lose weight. Ask what an ideal weight is for you.  Get at least 30 minutes of exercise that causes your heart to beat faster (aerobic exercise) most days of the week. Activities may include walking, swimming, or biking.  Work with your health care provider or diet and nutrition specialist (dietitian) to adjust your eating plan to your individual calorie needs. Reading food labels   Check food labels for the amount of sodium per serving. Choose foods with less than 5 percent of the Daily Value of sodium. Generally, foods with less than 300 mg of sodium per serving fit into this eating plan.  To find whole grains, look for the word "whole" as the first word in the ingredient list. Shopping  Buy products  labeled as "low-sodium" or "no salt added."  Buy fresh foods. Avoid canned foods and premade or frozen meals. Cooking  Avoid adding salt when cooking. Use salt-free seasonings or herbs instead of table salt or sea salt. Check with your health care provider or pharmacist before using salt substitutes.  Do not fry foods. Cook foods using healthy methods such as baking, boiling, grilling, and broiling instead.  Cook with heart-healthy oils, such as olive, canola, soybean, or sunflower oil. Meal planning  Eat a balanced diet that includes: ? 5 or more servings of fruits and vegetables each day. At each meal, try to fill half of your plate with fruits and vegetables. ? Up to 6-8 servings of whole grains each day. ? Less than 6 oz of lean meat, poultry, or fish each day. A 3-oz serving of meat is about the same size as a deck of cards. One egg equals 1 oz. ? 2 servings of low-fat dairy each day. ? A serving of nuts, seeds, or beans 5 times each week. ? Heart-healthy fats. Healthy fats called Omega-3 fatty acids are found in foods such as flaxseeds and coldwater fish, like sardines, salmon, and mackerel.  Limit how much you eat of the following: ? Canned or prepackaged foods. ? Food that is high in trans fat, such as fried foods. ? Food that is high in saturated fat, such as fatty meat. ? Sweets, desserts, sugary  drinks, and other foods with added sugar. ? Full-fat dairy products.  Do not salt foods before eating.  Try to eat at least 2 vegetarian meals each week.  Eat more home-cooked food and less restaurant, buffet, and fast food.  When eating at a restaurant, ask that your food be prepared with less salt or no salt, if possible. What foods are recommended? The items listed may not be a complete list. Talk with your dietitian about what dietary choices are best for you. Grains Whole-grain or whole-wheat bread. Whole-grain or whole-wheat pasta. Brown rice. [place]. Bulgur.  Whole-grain and low-sodium cereals. Pita bread. Low-fat, low-sodium crackers. Whole-wheat flour tortillas. Vegetables Fresh or frozen vegetables (raw, steamed, roasted, or grilled). Low-sodium or reduced-sodium tomato and vegetable juice. Low-sodium or reduced-sodium tomato sauce and tomato paste. Low-sodium or reduced-sodium canned vegetables. Fruits All fresh, dried, or frozen fruit. Canned fruit in natural juice (without added sugar). Meat and other protein foods Skinless chicken or [place]. Ground chicken or [place]. Pork with fat trimmed off. Fish and seafood. Egg whites. Dried beans, peas, or lentils. Unsalted nuts, nut butters, and seeds. Unsalted canned beans. Lean cuts of beef with fat trimmed off. Low-sodium, lean deli meat. Dairy Low-fat (1%) or fat-free (skim) milk. Fat-free, low-fat, or reduced-fat cheeses. Nonfat, low-sodium ricotta or cottage cheese. Low-fat or nonfat yogurt. Low-fat, low-sodium cheese. Fats and oils Soft margarine without trans fats. Vegetable oil. Low-fat, reduced-fat, or light mayonnaise and salad dressings (reduced-sodium). Canola, safflower, olive, soybean, and sunflower oils. Avocado. Seasoning and other foods Herbs. Spices. Seasoning mixes without salt. Unsalted popcorn and pretzels. Fat-free sweets. What foods are not recommended? The items listed may not be a complete list. Talk with your dietitian about what dietary choices are best for you. Grains Baked goods made with fat, such as croissants, muffins, or some breads. Dry pasta or rice meal packs. Vegetables Creamed or fried vegetables. Vegetables in a cheese sauce. Regular canned vegetables (not low-sodium or reduced-sodium). Regular canned tomato sauce and paste (not low-sodium or reduced-sodium). Regular tomato and vegetable juice (not low-sodium or reduced-sodium). [name]. Olives. Fruits Canned fruit in a light or heavy syrup. Fried fruit. Fruit in cream or butter sauce. Meat and other protein  foods Fatty cuts of meat. Ribs. Fried meat. Berniece Salines. Sausage. Bologna and other processed lunch meats. Salami. Fatback. Hotdogs. Bratwurst. Salted nuts and seeds. Canned beans with added salt. Canned or smoked fish. Whole eggs or egg yolks. Chicken or [place] with skin. Dairy Whole or 2% milk, cream, and half-and-half. Whole or full-fat cream cheese. Whole-fat or sweetened yogurt. Full-fat cheese. Nondairy creamers. Whipped toppings. Processed cheese and cheese spreads. Fats and oils Butter. Stick margarine. Lard. Shortening. Ghee. Bacon fat. Tropical oils, such as coconut, palm kernel, or palm oil. Seasoning and other foods Salted popcorn and pretzels. Onion salt, garlic salt, seasoned salt, table salt, and sea salt. Worcestershire sauce. Tartar sauce. Barbecue sauce. Teriyaki sauce. Soy sauce, including reduced-sodium. Steak sauce. Canned and packaged gravies. Fish sauce. Oyster sauce. Cocktail sauce. Horseradish that you find on the shelf. Ketchup. Mustard. Meat flavorings and tenderizers. Bouillon cubes. Hot sauce and Tabasco sauce. Premade or packaged marinades. Premade or packaged taco seasonings. Relishes. Regular salad dressings. Where to find more information:  National Heart, Lung, and [place]: https://wilson-eaton.com/  American Heart Association: www.heart.org Summary  The DASH eating plan is a healthy eating plan that has been shown to reduce high blood pressure (hypertension). It may also reduce your risk for type 2 diabetes, heart disease, and  stroke.  With the DASH eating plan, you should limit salt (sodium) intake to 2,300 mg a day. If you have hypertension, you may need to reduce your sodium intake to 1,500 mg a day.  When on the DASH eating plan, aim to eat more fresh fruits and vegetables, whole grains, lean proteins, low-fat dairy, and heart-healthy fats.  Work with your health care provider or diet and nutrition specialist (dietitian) to adjust your eating plan to your  individual calorie needs. This information is not intended to replace advice given to you by your health care provider. Make sure you discuss any questions you have with your health care provider. Document Revised: 12/06/2016 Document Reviewed: 12/18/2015 Elsevier Patient Education  2020 [employer].

## 2019-04-16 NOTE — Progress Notes (Signed)
Subjective  CC:  Chief Complaint  Patient presents with  . Hypertension    >140/80     HPI: Kelsey Grant is a 54 y.o. female who presents to the office today to address the problems listed above in the chief complaint. Blood pressure f/u: 6 week f/u for elevated bp w/o dx of htn; home readings are 140/90 or less consistently at home. Reports typically 135/70s-80s. Feels fine. Definitely would like to avoid HTN and need for meds. Diet is fair. She is obese. She doesn't exercise regularly.   Assessment  1. Prehypertension   2. Obesity (BMI 30-39.9)      Plan    Blood pressure and obesity f/u: discussed diagnosis of prehypertension. Will work hard on weight loss, daily walking for exercise and low salt diet. She is motivated. She will continue to follow her home readings. We will recheck in 6 weeks.   Education regarding management of these chronic disease states was given. Management strategies discussed on successive visits include dietary and exercise recommendations, goals of achieving and maintaining IBW, and lifestyle modifications aiming for adequate sleep and minimizing stressors.   Follow up: Return in about 6 months (around 10/16/2019) for follow uppre Hypertension.  No orders of the defined types were placed in this encounter.  No orders of the defined types were placed in this encounter.     BP Readings from Last 3 Encounters:  04/16/19 132/80  03/22/19 (!) 144/96  02/17/19 (!) 160/90   Wt Readings from Last 3 Encounters:  04/16/19 204 lb 9.6 oz (92.8 kg)  03/22/19 202 lb (91.6 kg)  02/17/19 201 lb 12.8 oz (91.5 kg)    Lab Results  Component Value Date   CHOL 150 02/17/2019   CHOL 157 02/12/2018   CHOL 159 03/14/2017   Lab Results  Component Value Date   HDL 48.30 02/17/2019   HDL 46.50 02/12/2018   HDL 55 03/14/2017   Lab Results  Component Value Date   LDLCALC 71 02/17/2019   LDLCALC 94 02/12/2018   LDLCALC 89 03/14/2017   Lab Results   Component Value Date   TRIG 151.0 (H) 02/17/2019   TRIG 82.0 02/12/2018   TRIG 67 03/14/2017   Lab Results  Component Value Date   CHOLHDL 3 02/17/2019   CHOLHDL 3 02/12/2018   No results found for: LDLDIRECT Lab Results  Component Value Date   CREATININE 1.01 02/17/2019   BUN 21 02/17/2019   NA 144 02/17/2019   K 3.6 02/17/2019   CL 110 02/17/2019   CO2 24 02/17/2019    The 10-year ASCVD risk score Mikey Bussing DC Jr., et al., 2013) is: 4.7%   Values used to calculate the score:     Age: 70 years     Sex: Female     Is Non-Hispanic African American: Yes     Diabetic: No     Tobacco smoker: Yes     Systolic Blood Pressure: [id number] mmHg     Is BP treated: No     HDL Cholesterol: 48.3 mg/dL     Total Cholesterol: 150 mg/dL  I reviewed the patients updated PMH, FH, and SocHx.    Patient Active Problem List   Diagnosis Date Noted  . Chronic pain syndrome 05/13/2017    Priority: High  . Chronic narcotic use 05/13/2017    Priority: High  . Mixed hyperlipidemia 05/13/2017    Priority: High  . Localized osteoarthrosis of left hip 05/13/2017    Priority: High  .  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 FH: colon cancer 04/25/2011    Priority: High  . GERD (gastroesophageal reflux disease) 10/16/2011    Priority: Medium  . Esophageal stricture 04/25/2011    Priority: Medium  . Prehypertension 04/16/2019  . Obesity (BMI 30-39.9) 02/17/2019  . Metatarsalgia of both feet 02/12/2018  . Colon cancer screening 10/16/2011    Allergies: Patient has no known allergies.  Social History: Patient  reports that she has been smoking cigars. She has smoked for the past 4.00 years. She has never used smokeless tobacco. She reports current alcohol use. She reports current drug use. Drug: Marijuana.  Current Meds  Medication Sig  . diphenhydrAMINE (BENADRYL) 25 MG tablet Take 1 tablet (25 mg total) by mouth at bedtime as needed.  . IBU 800 MG  tablet   . oxyCODONE-acetaminophen (PERCOCET/ROXICET) 5-325 MG tablet SMARTSIG:1 Tablet(s) By Mouth 3 Times Daily  . pravastatin (PRAVACHOL) 40 MG tablet Take 1 tablet (40 mg total) by mouth daily.    Review of Systems: Cardiovascular: negative for chest pain, palpitations, leg swelling, orthopnea Respiratory: negative for SOB, wheezing or persistent cough Gastrointestinal: negative for abdominal pain Genitourinary: negative for dysuria or gross hematuria  Objective  Vitals: BP 132/80   Pulse 92   Temp 97.8 F (36.6 C) (Temporal)   Resp 15   Ht 5\' 2"  (1.575 m)   Wt 204 lb 9.6 oz (92.8 kg)   SpO2 98%   BMI 37.42 kg/m  General: no acute distress  Psych:  Alert and oriented, normal mood and affect   Commons side effects, risks, benefits, and alternatives for medications and treatment plan prescribed today were discussed, and the patient expressed understanding of the given instructions. Patient is instructed to call or message via MyChart if he/she has any questions or concerns regarding our treatment plan. No barriers to understanding were identified. We discussed Red Flag symptoms and signs in detail. Patient expressed understanding regarding what to do in case of urgent or emergency type symptoms.   Medication list was reconciled, printed and provided to the patient in AVS. Patient instructions and summary information was reviewed with the patient as documented in the AVS. This note was prepared with assistance of Dragon voice recognition software. Occasional wrong-word or sound-a-like substitutions may have occurred due to the inherent limitations of voice recognition software  This visit occurred during the SARS-CoV-2 public health emergency.  Safety protocols were in place, including screening questions prior to the visit, additional usage of staff PPE, and extensive cleaning of exam room while observing appropriate contact time as indicated for disinfecting solutions.

## 2019-04-21 ENCOUNTER — Telehealth: Payer: Self-pay | Admitting: Obstetrics & Gynecology

## 2019-04-21 MED ORDER — MEDROXYPROGESTERONE ACETATE 10 MG PO TABS: 10.0000 mg | ORAL_TABLET | Freq: Every day | ORAL | 11 refills | Status: DC

## 2019-04-21 NOTE — Telephone Encounter (Signed)
Patient spoke with Dr. Elonda Husky yesterday regarding her medication medroxyprogesterone, patient would like to know if Dr. Elonda Husky could go ahead and send in that medication to her pharmacy. Please contact pt when medication sent.

## 2019-06-23 ENCOUNTER — Telehealth: Payer: Self-pay | Admitting: Obstetrics & Gynecology

## 2019-06-23 ENCOUNTER — Telehealth: Payer: Self-pay | Admitting: Family Medicine

## 2019-06-23 NOTE — Telephone Encounter (Signed)
Pt advised to take the Provera every 3 months, according to Dr. Brynda Greathouse note. She took it in April, so will take again in July. Pt voiced understanding. Kimberly

## 2019-06-23 NOTE — Telephone Encounter (Signed)
Ppw placed in your box for review along with last two notes, and med list.

## 2019-06-23 NOTE — Telephone Encounter (Signed)
Napoleon Form is calling from the department of social services asking if we received a request for medical information about the patient, did advise that we had it and it would take a few days before getting back to him but he explained that they are trying to place her grandchildren in a home with her today so they asking if there would be anyway that we could have it done today.

## 2019-06-23 NOTE — Telephone Encounter (Signed)
Patient is requesting refille on medication medroxyprogesterone to be sent to [place]

## 2019-06-23 NOTE — Telephone Encounter (Signed)
I reviewed paperwork.  Pt is competent.  She is physically capable. She does suffer from chronic pain and is on chronic narcotics. 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  She does not have a mental or emotional limitations.  You can put this in writing for them if needed.  Thanks, Dr. Jonni Sanger

## 2019-06-23 NOTE — Telephone Encounter (Signed)
Called and l/m to let know that we are faxing information. I have put the message in a letter and sent as well.

## 2019-09-07 ENCOUNTER — Other Ambulatory Visit: Payer: Self-pay | Admitting: Family Medicine

## 2019-09-17 ENCOUNTER — Other Ambulatory Visit: Payer: Self-pay | Admitting: Obstetrics & Gynecology

## 2019-09-17 ENCOUNTER — Other Ambulatory Visit: Payer: Self-pay | Admitting: Family Medicine

## 2019-09-17 NOTE — Telephone Encounter (Signed)
OK to refill? Not on her current med list

## 2019-10-18 ENCOUNTER — Ambulatory Visit: Payer: Medicare Other | Admitting: Family Medicine

## 2019-10-27 ENCOUNTER — Ambulatory Visit (INDEPENDENT_AMBULATORY_CARE_PROVIDER_SITE_OTHER): Payer: Medicare Other | Admitting: Family Medicine

## 2019-10-27 ENCOUNTER — Encounter: Payer: Self-pay | Admitting: Family Medicine

## 2019-10-27 ENCOUNTER — Other Ambulatory Visit: Payer: Self-pay

## 2019-10-27 VITALS — BP 128/80 | HR 87 | Temp 98.0°F | Resp 16 | Ht 62.0 in | Wt 204.2 lb

## 2019-10-27 DIAGNOSIS — K219 Gastro-esophageal reflux disease without esophagitis: Secondary | ICD-10-CM

## 2019-10-27 DIAGNOSIS — Z8679 Personal history of other diseases of the circulatory system: Secondary | ICD-10-CM

## 2019-10-27 DIAGNOSIS — K222 Esophageal obstruction: Secondary | ICD-10-CM | POA: Diagnosis not present

## 2019-10-27 MED ORDER — OMEPRAZOLE 20 MG PO CPDR: 20.0000 mg | DELAYED_RELEASE_CAPSULE | Freq: Two times a day (BID) | ORAL | 3 refills | Status: DC

## 2019-10-27 NOTE — Progress Notes (Signed)
Subjective  CC:  Chief Complaint  Patient presents with  . Hypertension  . Arthritis    bilateral lower back, hips, and legs  . Gastroesophageal Reflux    mostly at bedtime     HPI: Kelsey Grant is a 54 y.o. female who presents to the office today to address the problems listed above in the chief complaint.  Blood pressure f/u:  She has improved diet immensely. Eating low fat and low salt. No cp or sob. H/o htn with normal readings over last 6 months.   GERD: active reflux at night awakening "choking". No pain. H/o stricture requiring stretching x 2. No lower GI sxs.  Assessment  1. History of hypertension   2. Gastroesophageal reflux disease, unspecified whether esophagitis present   3. Esophageal stricture      Plan    Hypertension f/u: now normal. Praised. Monitor over time. Continue low salt diet.   Gerd: increase omeprazole to 20 bid x 4 weeks. If not improving, rec seeing GI again.  Pt refuses vaccines.  Education regarding management of these chronic disease states was given. Management strategies discussed on successive visits include dietary and exercise recommendations, goals of achieving and maintaining IBW, and lifestyle modifications aiming for adequate sleep and minimizing stressors.   Follow up: 6 months for cpe  No orders of the defined types were placed in this encounter.  Meds ordered this encounter  Medications  . omeprazole (PRILOSEC) 20 MG capsule    Sig: Take 1 capsule (20 mg total) by mouth in the morning and at bedtime.    Dispense:  90 capsule    Refill:  3      BP Readings from Last 3 Encounters:  10/27/19 128/80  04/16/19 132/80  03/22/19 (!) 144/96   Wt Readings from Last 3 Encounters:  10/27/19 204 lb 3.2 oz (92.6 kg)  04/16/19 204 lb 9.6 oz (92.8 kg)  03/22/19 202 lb (91.6 kg)    Lab Results  Component Value Date   CHOL 150 02/17/2019   CHOL 157 02/12/2018   CHOL 159 03/14/2017   Lab Results  Component Value Date    HDL 48.30 02/17/2019   HDL 46.50 02/12/2018   HDL 55 03/14/2017   Lab Results  Component Value Date   LDLCALC 71 02/17/2019   LDLCALC 94 02/12/2018   LDLCALC 89 03/14/2017   Lab Results  Component Value Date   TRIG 151.0 (H) 02/17/2019   TRIG 82.0 02/12/2018   TRIG 67 03/14/2017   Lab Results  Component Value Date   CHOLHDL 3 02/17/2019   CHOLHDL 3 02/12/2018   No results found for: LDLDIRECT Lab Results  Component Value Date   CREATININE 1.01 02/17/2019   BUN 21 02/17/2019   NA 144 02/17/2019   K 3.6 02/17/2019   CL 110 02/17/2019   CO2 24 02/17/2019    The 10-year ASCVD risk score Mikey Bussing DC Jr., et al., 2013) is: 4.2%   Values used to calculate the score:     Age: 87 years     Sex: Female     Is Non-Hispanic African American: Yes     Diabetic: No     Tobacco smoker: Yes     Systolic Blood Pressure: 062 mmHg     Is BP treated: No     HDL Cholesterol: 48.3 mg/dL     Total Cholesterol: 150 mg/dL  I reviewed the patients updated PMH, FH, and SocHx.    Patient Active Problem List  Diagnosis Date Noted  . Chronic pain syndrome 05/13/2017    Priority: High  . Chronic narcotic use 05/13/2017    Priority: High  . Mixed hyperlipidemia 05/13/2017    Priority: High  . Localized osteoarthrosis of left hip 05/13/2017    Priority: High  . 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 FH: colon cancer 04/25/2011    Priority: High  . GERD (gastroesophageal reflux disease) 10/16/2011    Priority: Medium  . Esophageal stricture 04/25/2011    Priority: Medium  . Prehypertension 04/16/2019  . Obesity (BMI 30-39.9) 02/17/2019  . Metatarsalgia of both feet 02/12/2018  . Colon cancer screening 10/16/2011    Allergies: Patient has no known allergies.  Social History: Patient  reports that she has been smoking cigars. She has smoked for the past 4.00 years. She has never used smokeless tobacco. She reports current alcohol use. She  reports current drug use. Drug: Marijuana.  Current Meds  Medication Sig  . diphenhydrAMINE (BENADRYL) 25 MG tablet Take 1 tablet (25 mg total) by mouth at bedtime as needed.  . IBU 800 MG tablet   . medroxyPROGESTERone (PROVERA) 10 MG tablet Take 1 tablet (10 mg total) by mouth daily. Every 3 months  . omeprazole (PRILOSEC) 20 MG capsule Take 1 capsule (20 mg total) by mouth in the morning and at bedtime.  Marland Kitchen oxyCODONE-acetaminophen (PERCOCET/ROXICET) 5-325 MG tablet SMARTSIG:1 Tablet(s) By Mouth 3 Times Daily  . pravastatin (PRAVACHOL) 40 MG tablet TAKE 1 TABLET BY MOUTH ONCE A DAY.  . [DISCONTINUED] omeprazole (PRILOSEC) 20 MG capsule Take 1 capsule (20 mg total) by mouth daily.    Review of Systems: Cardiovascular: negative for chest pain, palpitations, leg swelling, orthopnea Respiratory: negative for SOB, wheezing or persistent cough Gastrointestinal: negative for abdominal pain Genitourinary: negative for dysuria or gross hematuria  Objective  Vitals: BP 128/80   Pulse 87   Temp 98 F (36.7 C) (Temporal)   Resp 16   Ht 5\' 2"  (1.575 m)   Wt 204 lb 3.2 oz (92.6 kg)   SpO2 98%   BMI 37.35 kg/m  General: no acute distress  Psych:  Alert and oriented, normal mood and affect HEENT:  Normocephalic, atraumatic, supple neck  Cardiovascular:  RRR without murmur. no edema Respiratory:  Good breath sounds bilaterally, CTAB with normal respiratory effort Gastrointestinal: soft, flat abdomen, normal active bowel sounds, no palpable masses, no hepatosplenomegaly, no appreciated hernias   Commons side effects, risks, benefits, and alternatives for medications and treatment plan prescribed today were discussed, and the patient expressed understanding of the given instructions. Patient is instructed to call or message via MyChart if he/she has any questions or concerns regarding our treatment plan. No barriers to understanding were identified. We discussed Red Flag symptoms and signs in  detail. Patient expressed understanding regarding what to do in case of urgent or emergency type symptoms.   Medication list was reconciled, printed and provided to the patient in AVS. Patient instructions and summary information was reviewed with the patient as documented in the AVS. This note was prepared with assistance of Dragon voice recognition software. Occasional wrong-word or sound-a-like substitutions may have occurred due to the inherent limitations of voice recognition software  This visit occurred during the SARS-CoV-2 public health emergency.  Safety protocols were in place, including screening questions prior to the visit, additional usage of staff PPE, and extensive cleaning of exam room while observing appropriate contact time as indicated for  disinfecting solutions.

## 2019-10-27 NOTE — Patient Instructions (Signed)
Please return in 6 months for your annual complete physical; please come fasting.   Congratulations!! Your blood pressure is now normal!  Please increase your omeprazole to twice daily for the next 4 weeks to see if this improves your reflux symptoms. Let me know if it doesn't.   If you have any questions or concerns, please don't hesitate to send me a message via MyChart or call the office at [phone]. Thank you for visiting with [place] today! It's our pleasure caring for you.

## 2019-11-21 ENCOUNTER — Other Ambulatory Visit: Payer: Self-pay | Admitting: Family Medicine

## 2019-12-25 ENCOUNTER — Other Ambulatory Visit: Payer: Self-pay | Admitting: Family Medicine

## 2019-12-26 ENCOUNTER — Other Ambulatory Visit: Payer: Self-pay | Admitting: Nurse Practitioner

## 2019-12-26 ENCOUNTER — Other Ambulatory Visit: Payer: Self-pay | Admitting: Family Medicine

## 2019-12-27 MED ORDER — LORATADINE 10 MG PO TABS: 10.0000 mg | ORAL_TABLET | Freq: Every day | ORAL | 1 refills | Status: DC

## 2019-12-27 MED ORDER — DIPHENHYDRAMINE HCL 25 MG PO TABS: 25.0000 mg | ORAL_TABLET | Freq: Every evening | ORAL | 1 refills | Status: DC | PRN

## 2020-01-14 ENCOUNTER — Ambulatory Visit
Admission: EM | Admit: 2020-01-14 | Discharge: 2020-01-14 | Disposition: A | Discharge status: Home / Self Care | Payer: Medicare Other | Attending: Family Medicine | Admitting: Family Medicine

## 2020-01-14 ENCOUNTER — Other Ambulatory Visit: Payer: Self-pay

## 2020-01-14 ENCOUNTER — Encounter: Payer: Self-pay | Admitting: Emergency Medicine

## 2020-01-14 DIAGNOSIS — M79644 Pain in right finger(s): Secondary | ICD-10-CM

## 2020-01-14 DIAGNOSIS — T148XXA Other injury of unspecified body region, initial encounter: Secondary | ICD-10-CM

## 2020-01-14 DIAGNOSIS — M549 Dorsalgia, unspecified: Secondary | ICD-10-CM

## 2020-01-14 DIAGNOSIS — M542 Cervicalgia: Secondary | ICD-10-CM

## 2020-01-14 DIAGNOSIS — S63601A Unspecified sprain of right thumb, initial encounter: Secondary | ICD-10-CM

## 2020-01-14 MED ORDER — CYCLOBENZAPRINE HCL 10 MG PO TABS: 10.0000 mg | ORAL_TABLET | Freq: Two times a day (BID) | ORAL | 0 refills | Status: DC | PRN
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
## 2020-01-14 NOTE — Discharge Instructions (Signed)
I have sent in flexeril for you to take twice a day as needed for muscle spasms. This medication can make you sleepy. Do not drive or operate heavy machinery with this medication.  Continue home medication regimen  Follow up with this office or with primary care if symptoms are persisting.  Follow up in the ER for high fever, trouble swallowing, trouble breathing, other concerning symptoms.
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
## 2020-01-29 ENCOUNTER — Other Ambulatory Visit: Payer: Self-pay | Admitting: Family Medicine

## 2020-01-31 ENCOUNTER — Encounter: Payer: Self-pay | Admitting: Family Medicine

## 2020-02-02 ENCOUNTER — Other Ambulatory Visit: Payer: Self-pay

## 2020-02-02 MED ORDER — OMEPRAZOLE 20 MG PO CPDR: 20.0000 mg | DELAYED_RELEASE_CAPSULE | Freq: Two times a day (BID) | ORAL | 3 refills | Status: DC

## 2020-02-18 ENCOUNTER — Ambulatory Visit (INDEPENDENT_AMBULATORY_CARE_PROVIDER_SITE_OTHER): Payer: Medicare Other | Admitting: Family Medicine

## 2020-02-18 ENCOUNTER — Other Ambulatory Visit: Payer: Self-pay

## 2020-02-18 ENCOUNTER — Encounter: Payer: Self-pay | Admitting: Family Medicine

## 2020-02-18 VITALS — BP 135/84 | HR 84 | Temp 97.9°F | Ht 62.0 in | Wt 201.2 lb

## 2020-02-18 DIAGNOSIS — R030 Elevated blood-pressure reading, without diagnosis of hypertension: Secondary | ICD-10-CM | POA: Diagnosis not present

## 2020-02-18 DIAGNOSIS — R220 Localized swelling, mass and lump, head: Secondary | ICD-10-CM

## 2020-02-18 DIAGNOSIS — Z2821 Immunization not carried out because of patient refusal: Secondary | ICD-10-CM | POA: Diagnosis not present

## 2020-02-18 DIAGNOSIS — E669 Obesity, unspecified: Secondary | ICD-10-CM

## 2020-02-18 DIAGNOSIS — T7840XA Allergy, unspecified, initial encounter: Secondary | ICD-10-CM

## 2020-02-18 DIAGNOSIS — F1190 Opioid use, unspecified, uncomplicated: Secondary | ICD-10-CM

## 2020-02-18 DIAGNOSIS — G894 Chronic pain syndrome: Secondary | ICD-10-CM

## 2020-02-18 DIAGNOSIS — F17290 Nicotine dependence, other tobacco product, uncomplicated: Secondary | ICD-10-CM | POA: Insufficient documentation

## 2020-02-18 MED ORDER — PREDNISONE 10 MG PO TABS: ORAL_TABLET | ORAL | 0 refills | Status: DC

## 2020-02-18 NOTE — Progress Notes (Signed)
Subjective  CC:  Chief Complaint  Patient presents with  . Lip Itching    And peeling. Patient states that she put sunscreen on her lips over 2/3 years ago and it's still flaring up.     HPI: Kelsey Grant is a 55 y.o. female who presents to the office today to address the problems listed above in the chief complaint.  55 year old reports 3-week history of swollen itching lips. Started spontaneously. She is unaware of any precipitants. This happened to her 1 time before about 2 or 3 years ago. At that time it was after she used sunscreen on her lips. She denies using any new lip balm's, make-up, face soaps or new foods or medicines. She denies pain. She denies tongue swelling or shortness of breath. She has tried using Vaseline over-the-counter lip balm without any relief. The lips do itch. She denies any pain. No other rashes or hives. She does have allergies but stopped Claritin a while ago. She otherwise feels fine.  Prehypertension: Blood pressures remain in the normal range. She checks periodically at home. Also checks at the pain management center. No chest pain or lower extremity edema.  She does smoke occasional cigars. She does this for pleasure. No nicotine dependence. She does not want to stop.  Obesity: Weight is stable. BMI remains above 30. She tries to eat well. Exercise is limited due to chronic pain.  Chronic pain on chronic narcotics well controlled per patient. Goes to [facility]  She declines Covid vaccines and all vaccines. We have discussed this in the past.  Assessment  1. Swelling of both lips   2. Allergic reaction, initial encounter   3. Prehypertension   4. No vaccination-pt refuse   5. Obesity (BMI 30-39.9)   6. Chronic pain syndrome   7. Cigar smoker unmotivated to quit   8. Chronic narcotic use      Plan   Swelling of both lips, likely allergic reaction, unclear allergen: No signs of angioedema. She is not on ACE inhibitor.  She feels well. Start Zyrtec or Claritin and add prednisone for 1 week. Follow-up if not proving. Discussed red flag symptoms like tongue swelling or shortness of breath. She understands she should get to the emergency center if that happens.  Prehypertension: Continue to monitor. Continue low-sodium diet. Recommend weight loss.  Vaccine counseling done.  Chronic pain on chronic narcotics: Per pain management. Reports well controlled.  Occasional cigar smoker. She understands risks.  Follow up: As scheduled for complete physical. Will follow blood pressure and lipids 05/17/2020  No orders of the defined types were placed in this encounter.  Meds ordered this encounter  Medications  . predniSONE (DELTASONE) 10 MG tablet    Sig: Take 4 tabs qd x 2 days, 3 qd x 2 days, 2 qd x 2d, 1qd x 3 days    Dispense:  21 tablet    Refill:  0      I reviewed the patients updated PMH, FH, and SocHx.    Patient Active Problem List   Diagnosis Date Noted  . Prehypertension 04/16/2019    Priority: High  . Obesity (BMI 30-39.9) 02/17/2019    Priority: High  . Chronic pain syndrome 05/13/2017    Priority: High  . Chronic narcotic use 05/13/2017    Priority: High  . Mixed hyperlipidemia 05/13/2017    Priority: High  . Localized osteoarthrosis of left hip 05/13/2017    Priority: High  . Degenerative joint  disease (DJD) of lumbar spine 05/13/2017    Priority: High  . Family history of colon cancer in father 04/25/2011    Priority: High  . GERD (gastroesophageal reflux disease) 10/16/2011    Priority: Medium  . Esophageal stricture 04/25/2011    Priority: Medium  . Cigar smoker unmotivated to quit 02/18/2020    Priority: Low  . Metatarsalgia of both feet 02/12/2018    Priority: Low  . No vaccination-pt refuse 02/18/2020   Current Meds  Medication Sig  . diphenhydrAMINE (BENADRYL) 25 MG tablet Take 1 tablet (25 mg total) by mouth at bedtime as needed.  . IBU 800 MG tablet   .  medroxyPROGESTERone (PROVERA) 10 MG tablet Take 1 tablet (10 mg total) by mouth daily. Every 3 months  . omeprazole (PRILOSEC) 20 MG capsule Take 1 capsule (20 mg total) by mouth in the morning and at bedtime.  Marland Kitchen oxyCODONE-acetaminophen (PERCOCET/ROXICET) 5-325 MG tablet SMARTSIG:1 Tablet(s) By Mouth 3 Times Daily  . predniSONE (DELTASONE) 10 MG tablet Take 4 tabs qd x 2 days, 3 qd x 2 days, 2 qd x 2d, 1qd x 3 days    Allergies: Patient has No Known Allergies. Family History: Patient family history includes Arthritis in her mother; Cancer in her father; Colon cancer in her father; Diabetes in her brother, brother, father, and mother; GER disease in her son; Hyperlipidemia in her brother and father; Hypertension in her brother, brother, and father; Thrombosis in her mother. Social History:  Patient  reports that she has been smoking cigars. She has smoked for the past 4.00 years. She has never used smokeless tobacco. She reports current alcohol use. She reports current drug use. Drug: Marijuana.  Review of Systems: Constitutional: Negative for fever malaise or anorexia Cardiovascular: negative for chest pain Respiratory: negative for SOB or persistent cough Gastrointestinal: negative for abdominal pain Wt Readings from Last 3 Encounters:  02/18/20 201 lb 3.2 oz (91.3 kg)  01/14/20 202 lb 13.2 oz (92 kg)  10/27/19 204 lb 3.2 oz (92.6 kg)    Objective  Vitals: BP 135/84   Pulse 84   Temp 97.9 F (36.6 C) (Temporal)   Ht 5\' 2"  (1.575 m)   Wt 201 lb 3.2 oz (91.3 kg)   SpO2 99%   BMI 36.80 kg/m  General: no acute distress , A&Ox3, no respiratory distress HEENT: PEERL, conjunctiva normal, neck is supple, no lymphadenopathy, bilateral swollen lips without vesicles, flaking or surrounding erythema. There is no desquamation of the skin., Normal tongue. Open airway Cardiovascular:  RRR without murmur or gallop.  Respiratory:  Good breath sounds bilaterally, CTAB with normal respiratory  effort Skin:  Warm, no rashes    Commons side effects, risks, benefits, and alternatives for medications and treatment plan prescribed today were discussed, and the patient expressed understanding of the given instructions. Patient is instructed to call or message via MyChart if he/she has any questions or concerns regarding our treatment plan. No barriers to understanding were identified. We discussed Red Flag symptoms and signs in detail. Patient expressed understanding regarding what to do in case of urgent or emergency type symptoms.   Medication list was reconciled, printed and provided to the patient in AVS. Patient instructions and summary information was reviewed with the patient as documented in the AVS. This note was prepared with assistance of Dragon voice recognition software. Occasional wrong-word or sound-a-like substitutions may have occurred due to the inherent limitations of voice recognition software  This visit occurred during the SARS-CoV-2 public  health emergency.  Safety protocols were in place, including screening questions prior to the visit, additional usage of staff PPE, and extensive cleaning of exam room while observing appropriate contact time as indicated for disinfecting solutions.

## 2020-02-18 NOTE — Patient Instructions (Addendum)
Please follow up as scheduled for your next visit with me: 05/17/2020   If you have any questions or concerns, please don't hesitate to send me a message via MyChart or call the office at [phone]. Thank you for visiting with [place] today! It's our pleasure caring for you.  Restart claritin daily or zyrtec daily (generics are fine). Take the prednisone as directed and let me know if it does not resolve.

## 2020-03-15 ENCOUNTER — Other Ambulatory Visit (HOSPITAL_COMMUNITY): Payer: Self-pay | Admitting: Family Medicine

## 2020-03-15 DIAGNOSIS — Z1231 Encounter for screening mammogram for malignant neoplasm of breast: Secondary | ICD-10-CM

## 2020-03-20 ENCOUNTER — Other Ambulatory Visit: Payer: Self-pay

## 2020-03-20 ENCOUNTER — Ambulatory Visit (HOSPITAL_COMMUNITY): Payer: Medicare Other

## 2020-03-20 ENCOUNTER — Ambulatory Visit (INDEPENDENT_AMBULATORY_CARE_PROVIDER_SITE_OTHER): Payer: Medicare Other

## 2020-03-20 VITALS — BP 124/78 | HR 93 | Temp 97.4°F | Wt 202.4 lb

## 2020-03-20 DIAGNOSIS — Z0000 Encounter for general adult medical examination without abnormal findings: Secondary | ICD-10-CM | POA: Diagnosis not present

## 2020-03-20 NOTE — Patient Instructions (Addendum)
Kelsey Grant , Thank you for taking time to come for your Medicare Wellness Visit. I appreciate your ongoing commitment to your health goals. Please review the following plan we discussed and let me know if I can assist you in the future.   Screening recommendations/referrals: Colonoscopy: Done 03/08/11 Mammogram: Done 03/10/19 Recommended yearly ophthalmology/optometry visit for glaucoma screening and checkup Recommended yearly dental visit for hygiene and checkup  Vaccinations: Influenza vaccine: Declined  Pneumococcal vaccine: Declined Tdap vaccine: Declined Shingles vaccine: Shingrix discussed. Please contact your pharmacy for coverage information.   Covid-19: Declined and discussed  Advanced directives: Advance directive discussed with you today. I have provided a copy for you to complete at home and have notarized. Once this is complete please bring a copy in to our office so we can scan it into your chart.  Conditions/risks identified: Get rid of pain  Next appointment: Follow up in one year for your annual wellness visit.   Preventive Care 40-64 Years, Female Preventive care refers to lifestyle choices and visits with your health care provider that can promote health and wellness. What does preventive care include?  A yearly physical exam. This is also called an annual well check.  Dental exams once or twice a year.  Routine eye exams. Ask your health care provider how often you should have your eyes checked.  Personal lifestyle choices, including:  Daily care of your teeth and gums.  Regular physical activity.  Eating a healthy diet.  Avoiding tobacco and drug use.  Limiting alcohol use.  Practicing safe sex.  Taking low-dose aspirin daily starting at age 14.  Taking vitamin and mineral supplements as recommended by your health care provider. What happens during an annual well check? The services and screenings done by your health care provider during your  annual well check will depend on your age, overall health, lifestyle risk factors, and family history of disease. Counseling  Your health care provider may ask you questions about your:  Alcohol use.  Tobacco use.  Drug use.  Emotional well-being.  Home and relationship well-being.  Sexual activity.  Eating habits.  Work and work Statistician.  Method of birth control.  Menstrual cycle.  Pregnancy history. Screening  You may have the following tests or measurements:  Height, weight, and BMI.  Blood pressure.  Lipid and cholesterol levels. These may be checked every 5 years, or more frequently if you are over 24 years old.  Skin check.  Lung cancer screening. You may have this screening every year starting at age 42 if you have a 30-pack-year history of smoking and currently smoke or have quit within the past 15 years.  Fecal occult blood test (FOBT) of the stool. You may have this test every year starting at age 97.  Flexible sigmoidoscopy or colonoscopy. You may have a sigmoidoscopy every 5 years or a colonoscopy every 10 years starting at age 75.  Hepatitis C blood test.  Hepatitis B blood test.  Sexually transmitted disease (STD) testing.  Diabetes screening. This is done by checking your blood sugar (glucose) after you have not eaten for a while (fasting). You may have this done every 1-3 years.  Mammogram. This may be done every 1-2 years. Talk to your health care provider about when you should start having regular mammograms. This may depend on whether you have a family history of breast cancer.  BRCA-related cancer screening. This may be done if you have a family history of breast, ovarian, tubal, or peritoneal cancers.  Pelvic exam and Pap test. This may be done every 3 years starting at age 60. Starting at age 97, this may be done every 5 years if you have a Pap test in combination with an HPV test.  Bone density scan. This is done to screen for  osteoporosis. You may have this scan if you are at high risk for osteoporosis. Discuss your test results, treatment options, and if necessary, the need for more tests with your health care provider. Vaccines  Your health care provider may recommend certain vaccines, such as:  Influenza vaccine. This is recommended every year.  Tetanus, diphtheria, and acellular pertussis (Tdap, Td) vaccine. You may need a Td booster every 10 years.  Zoster vaccine. You may need this after age 52.  Pneumococcal 13-valent conjugate (PCV13) vaccine. You may need this if you have certain conditions and were not previously vaccinated.  Pneumococcal polysaccharide (PPSV23) vaccine. You may need one or two doses if you smoke cigarettes or if you have certain conditions. Talk to your health care provider about which screenings and vaccines you need and how often you need them. This information is not intended to replace advice given to you by your health care provider. Make sure you discuss any questions you have with your health care provider. Document Released: 01/20/2015 Document Revised: 09/13/2015 Document Reviewed: 10/25/2014 Elsevier Interactive Patient Education  2017 [employer] Prevention in the Home Falls can cause injuries. They can happen to people of all ages. There are many things you can do to make your home safe and to help prevent falls. What can I do on the outside of my home?  Regularly fix the edges of walkways and driveways and fix any cracks.  Remove anything that might make you trip as you walk through a door, such as a raised step or threshold.  Trim any bushes or trees on the path to your home.  Use bright outdoor lighting.  Clear any walking paths of anything that might make someone trip, such as rocks or tools.  Regularly check to see if handrails are loose or broken. Make sure that both sides of any steps have handrails.  Any raised decks and porches should have  guardrails on the edges.  Have any leaves, snow, or ice cleared regularly.  Use sand or salt on walking paths during winter.  Clean up any spills in your garage right away. This includes oil or grease spills. What can I do in the bathroom?  Use night lights.  Install grab bars by the toilet and in the tub and shower. Do not use towel bars as grab bars.  Use non-skid mats or decals in the tub or shower.  If you need to sit down in the shower, use a plastic, non-slip stool.  Keep the floor dry. Clean up any water that spills on the floor as soon as it happens.  Remove soap buildup in the tub or shower regularly.  Attach bath mats securely with double-sided non-slip rug tape.  Do not have throw rugs and other things on the floor that can make you trip. What can I do in the bedroom?  Use night lights.  Make sure that you have a light by your bed that is easy to reach.  Do not use any sheets or blankets that are too big for your bed. They should not hang down onto the floor.  Have a firm chair that has side arms. You can use this for  support while you get dressed.  Do not have throw rugs and other things on the floor that can make you trip. What can I do in the kitchen?  Clean up any spills right away.  Avoid walking on wet floors.  Keep items that you use a lot in easy-to-reach places.  If you need to reach something above you, use a strong step stool that has a grab bar.  Keep electrical cords out of the way.  Do not use floor polish or wax that makes floors slippery. If you must use wax, use non-skid floor wax.  Do not have throw rugs and other things on the floor that can make you trip. What can I do with my stairs?  Do not leave any items on the stairs.  Make sure that there are handrails on both sides of the stairs and use them. Fix handrails that are broken or loose. Make sure that handrails are as long as the stairways.  Check any carpeting to make sure that  it is firmly attached to the stairs. Fix any carpet that is loose or worn.  Avoid having throw rugs at the top or bottom of the stairs. If you do have throw rugs, attach them to the floor with carpet tape.  Make sure that you have a light switch at the top of the stairs and the bottom of the stairs. If you do not have them, ask someone to add them for you. What else can I do to help prevent falls?  Wear shoes that:  Do not have high heels.  Have rubber bottoms.  Are comfortable and fit you well.  Are closed at the toe. Do not wear sandals.  If you use a stepladder:  Make sure that it is fully opened. Do not climb a closed stepladder.  Make sure that both sides of the stepladder are locked into place.  Ask someone to hold it for you, if possible.  Clearly mark and make sure that you can see:  Any grab bars or handrails.  First and last steps.  Where the edge of each step is.  Use tools that help you move around (mobility aids) if they are needed. These include:  Canes.  Walkers.  Scooters.  Crutches.  Turn on the lights when you go into a dark area. Replace any light bulbs as soon as they burn out.  Set up your furniture so you have a clear path. Avoid moving your furniture around.  If any of your floors are uneven, fix them.  If there are any pets around you, be aware of where they are.  Review your medicines with your doctor. Some medicines can make you feel dizzy. This can increase your chance of falling. Ask your doctor what other things that you can do to help prevent falls. This information is not intended to replace advice given to you by your health care provider. Make sure you discuss any questions you have with your health care provider. Document Released: 10/20/2008 Document Revised: 06/01/2015 Document Reviewed: 01/28/2014 Elsevier Interactive Patient Education  2017 [employer].
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
## 2020-04-05 ENCOUNTER — Ambulatory Visit (HOSPITAL_COMMUNITY)
Admission: RE | Admit: 2020-04-05 | Discharge: 2020-04-05 | Disposition: A | Discharge status: Home / Self Care | Payer: Medicare Other | Source: Ambulatory Visit | Attending: Family Medicine | Admitting: Family Medicine

## 2020-04-05 ENCOUNTER — Other Ambulatory Visit: Payer: Self-pay

## 2020-04-05 DIAGNOSIS — Z1231 Encounter for screening mammogram for malignant neoplasm of breast: Secondary | ICD-10-CM | POA: Insufficient documentation

## 2020-04-13 ENCOUNTER — Other Ambulatory Visit: Payer: Self-pay

## 2020-04-13 ENCOUNTER — Other Ambulatory Visit (HOSPITAL_COMMUNITY)
Admission: RE | Admit: 2020-04-13 | Discharge: 2020-04-13 | Disposition: A | Discharge status: Home / Self Care | Payer: Medicare Other | Source: Ambulatory Visit | Attending: Obstetrics & Gynecology | Admitting: Obstetrics & Gynecology

## 2020-04-13 ENCOUNTER — Ambulatory Visit (INDEPENDENT_AMBULATORY_CARE_PROVIDER_SITE_OTHER): Payer: Medicare Other | Admitting: Obstetrics & Gynecology

## 2020-04-13 ENCOUNTER — Encounter: Payer: Self-pay | Admitting: Obstetrics & Gynecology

## 2020-04-13 VITALS — BP 138/87 | HR 80 | Ht 62.0 in | Wt 205.0 lb

## 2020-04-13 DIAGNOSIS — Z01419 Encounter for gynecological examination (general) (routine) without abnormal findings: Secondary | ICD-10-CM | POA: Diagnosis not present

## 2020-04-13 DIAGNOSIS — Z1211 Encounter for screening for malignant neoplasm of colon: Secondary | ICD-10-CM

## 2020-04-13 DIAGNOSIS — Z1212 Encounter for screening for malignant neoplasm of rectum: Secondary | ICD-10-CM
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
## 2020-04-19 LAB — CYTOLOGY - PAP
Comment: NEGATIVE
Diagnosis: NEGATIVE
High risk HPV: NEGATIVE

## 2020-04-26 ENCOUNTER — Encounter: Payer: Medicare Other | Admitting: Family Medicine

## 2020-05-15 ENCOUNTER — Other Ambulatory Visit: Payer: Self-pay | Admitting: Obstetrics & Gynecology

## 2020-05-17 ENCOUNTER — Ambulatory Visit (INDEPENDENT_AMBULATORY_CARE_PROVIDER_SITE_OTHER): Payer: Medicare Other | Admitting: Family Medicine

## 2020-05-17 ENCOUNTER — Other Ambulatory Visit: Payer: Self-pay

## 2020-05-17 ENCOUNTER — Encounter: Payer: Self-pay | Admitting: Family Medicine

## 2020-05-17 VITALS — BP 128/80 | HR 81 | Temp 98.0°F | Ht 62.0 in | Wt 205.4 lb

## 2020-05-17 DIAGNOSIS — K219 Gastro-esophageal reflux disease without esophagitis: Secondary | ICD-10-CM | POA: Diagnosis not present

## 2020-05-17 DIAGNOSIS — E669 Obesity, unspecified: Secondary | ICD-10-CM

## 2020-05-17 DIAGNOSIS — G894 Chronic pain syndrome: Secondary | ICD-10-CM

## 2020-05-17 DIAGNOSIS — Z800 Family history of malignant neoplasm of digestive organs: Secondary | ICD-10-CM | POA: Diagnosis not present

## 2020-05-17 DIAGNOSIS — M47816 Spondylosis without myelopathy or radiculopathy, lumbar region: Secondary | ICD-10-CM

## 2020-05-17 DIAGNOSIS — F1190 Opioid use, unspecified, uncomplicated: Secondary | ICD-10-CM

## 2020-05-17 DIAGNOSIS — F17290 Nicotine dependence, other tobacco product, uncomplicated: Secondary | ICD-10-CM

## 2020-05-17 DIAGNOSIS — Z1159 Encounter for screening for other viral diseases: Secondary | ICD-10-CM

## 2020-05-17 DIAGNOSIS — E782 Mixed hyperlipidemia: Secondary | ICD-10-CM | POA: Diagnosis not present

## 2020-05-17 DIAGNOSIS — Z2821 Immunization not carried out because of patient refusal: Secondary | ICD-10-CM

## 2020-05-17 LAB — CBC WITH DIFFERENTIAL/PLATELET
Basophils Absolute: 0.1 10*3/uL (ref 0.0–0.1)
Basophils Relative: 1.2 % (ref 0.0–3.0)
Eosinophils Absolute: 0.2 10*3/uL (ref 0.0–0.7)
Eosinophils Relative: 3.6 % (ref 0.0–5.0)
HCT: 38.4 % (ref 36.0–46.0)
Hemoglobin: 12.9 g/dL (ref 12.0–15.0)
Lymphocytes Relative: 41.7 % (ref 12.0–46.0)
Lymphs Abs: 2.1 10*3/uL (ref 0.7–4.0)
MCHC: 33.5 g/dL (ref 30.0–36.0)
MCV: 93.4 fl (ref 78.0–100.0)
Monocytes Absolute: 0.4 10*3/uL (ref 0.1–1.0)
Monocytes Relative: 8.9 % (ref 3.0–12.0)
Neutro Abs: 2.2 10*3/uL (ref 1.4–7.7)
Neutrophils Relative %: 44.6 % (ref 43.0–77.0)
Platelets: 272 10*3/uL (ref 150.0–400.0)
RBC: 4.11 Mil/uL (ref 3.87–5.11)
RDW: 13.4 % (ref 11.5–15.5)
WBC: 5 10*3/uL (ref 4.0–10.5)

## 2020-05-17 LAB — LIPID PANEL
Cholesterol: 152 mg/dL (ref 0–200)
HDL: 45.8 mg/dL (ref >39.00)
LDL Cholesterol: 80 mg/dL (ref 0–99)
NonHDL: 105.71
Total CHOL/HDL Ratio: 3
Triglycerides: 131 mg/dL (ref 0.0–149.0)
VLDL: 26.2 mg/dL (ref 0.0–40.0)

## 2020-05-17 LAB — H. PYLORI ANTIBODY, IGG: H Pylori IgG: POSITIVE — AB

## 2020-05-17 LAB — COMPREHENSIVE METABOLIC PANEL
ALT: 15 U/L (ref 0–35)
AST: 16 U/L (ref 0–37)
Albumin: 4 g/dL (ref 3.5–5.2)
Alkaline Phosphatase: 90 U/L (ref 39–117)
BUN: 13 mg/dL (ref 6–23)
CO2: 23 mEq/L (ref 19–32)
Calcium: 9.2 mg/dL (ref 8.4–10.5)
Chloride: 111 mEq/L (ref 96–112)
Creatinine, Ser: 0.8 mg/dL (ref 0.40–1.20)
GFR: 84.3 mL/min (ref >60.00)
Glucose, Bld: 93 mg/dL (ref 70–99)
Potassium: 3.5 mEq/L (ref 3.5–5.1)
Sodium: 142 mEq/L (ref 135–145)
Total Bilirubin: 0.2 mg/dL (ref 0.2–1.2)
Total Protein: 6.4 g/dL (ref 6.0–8.3)

## 2020-05-17 LAB — TSH: TSH: 1.35 u[IU]/mL (ref 0.35–4.50)

## 2020-05-17 MED ORDER — PANTOPRAZOLE SODIUM 40 MG PO TBEC: 40.0000 mg | DELAYED_RELEASE_TABLET | Freq: Every day | ORAL | 3 refills | Status: DC

## 2020-05-17 NOTE — Progress Notes (Signed)
Subjective  Chief Complaint  Patient presents with  . Annual Exam    Not fasting  . Gastroesophageal Reflux    Patient states medication she takes for this does not work.    HPI: Kelsey Grant is a 55 y.o. female who presents to [facility] [place] today for a Female Wellness Visit. She also has the concerns and/or needs as listed above in the chief complaint. These will be addressed in addition to the Health Maintenance Visit.   Wellness Visit: annual visit with health maintenance review and exam without Pap   HM: reviewed recent gyn female wellness exam notes. Screens are up to date. Declines vaccines.   Chronic disease f/u and/or acute problem visit: (deemed necessary to be done in addition to the wellness visit):  Mixed hyperlipidemia on pravastatin.  Well-tolerated.  Chronic back pain on chronic narcotics per pain management at [facility].  Having more aches and pains but keeps active.  No radicular symptoms.  History of hypertension: Remains normotensive and weight is stable.  GERD: Having increased reflux especially in the middle of the night.  Minimal pain.  We increased omeprazole from 20 daily to 20 twice daily about 3 months ago, however symptoms persist and are worsening.  Has history of esophageal stricture.  He has GI doctor.  No melena.  No change in appetite.  No postprandial pain.  No right upper quadrant colicky pain.  Assessment  1. Mixed hyperlipidemia   2. Family history of colon cancer in father   3. Spondylosis of lumbar region without myelopathy or radiculopathy   4. Gastroesophageal reflux disease, unspecified whether esophagitis present   5. Cigar smoker unmotivated to quit   6. No vaccination-pt refuse   7. Obesity (BMI 30-39.9)   8. Chronic pain syndrome   9. Chronic narcotic use   10. Need for hepatitis C screening test      Plan  Female Wellness Visit:  Age appropriate Health Maintenance and Prevention  measures were discussed with patient. Included topics are cancer screening recommendations, ways to keep healthy (see AVS) including dietary and exercise recommendations, regular eye and dental care, use of seat belts, and avoidance of moderate alcohol use and tobacco use.   BMI: discussed patient's BMI and encouraged positive lifestyle modifications to help get to or maintain a target BMI.  HM needs and immunizations were addressed and ordered. See below for orders. See HM and immunization section for updates.  Routine labs and screening tests ordered including cmp, cbc and lipids where appropriate.  Discussed recommendations regarding Vit D and calcium supplementation (see AVS)  Chronic disease management visit and/or acute problem visit:  Recheck lipids and LFTs on statin.  Continue pravastatin 40 daily.  Chronic narcotics for pain management.  GERD: Active.  Change Protonix 40 mg daily and patient to schedule with GI for further evaluation.  May require EGD with dilatation.  Check H. Pylori.  Follow up: 12 months for complete physical Orders Placed This Encounter  Procedures  . CBC with Differential/Platelet  . Comprehensive metabolic panel  . Hepatitis C antibody  . Lipid panel  . H. pylori antibody, IgG  . TSH   Meds ordered this encounter  Medications  . pantoprazole (PROTONIX) 40 MG tablet    Sig: Take 1 tablet (40 mg total) by mouth daily.    Dispense:  90 tablet    Refill:  3      Body mass index is 37.57 kg/m. Wt Readings  from Last 3 Encounters:  05/17/20 205 lb 6.4 oz (93.2 kg)  04/13/20 205 lb (93 kg)  03/20/20 202 lb 6.4 oz (91.8 kg)     Patient Active Problem List   Diagnosis Date Noted  . Prehypertension 04/16/2019    Priority: High  . Obesity (BMI 30-39.9) 02/17/2019    Priority: High  . Chronic pain syndrome 05/13/2017    Priority: High  . Chronic narcotic use 05/13/2017    Priority: High  . Mixed hyperlipidemia 05/13/2017    Priority: High   . Localized osteoarthrosis of left hip 05/13/2017    Priority: High  . 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 Family history of colon cancer in father 04/25/2011    Priority: High    Father, 65s   . GERD (gastroesophageal reflux disease) 10/16/2011    Priority: Medium  . Esophageal stricture 04/25/2011    Priority: Medium    Egd/dil jun 2013   . Cigar smoker unmotivated to quit 02/18/2020    Priority: Low    occasional cigar   . Metatarsalgia of both feet 02/12/2018    Priority: Low  . No vaccination-pt refuse 02/18/2020   Health Maintenance  Topic Date Due  . Hepatitis C Screening  Never done  . COLONOSCOPY (Pts 45-32yr Insurance coverage will need to be confirmed)  03/07/2021  . MAMMOGRAM  04/05/2021  . PAP SMEAR-Modifier  04/13/2025  . HIV Screening  Completed  . HPV VACCINES  Aged Out  . INFLUENZA VACCINE  Discontinued  . TETANUS/TDAP  Discontinued  . COVID-19 Vaccine  Discontinued    There is no immunization history on file for this patient. We updated and reviewed the patient's past history in detail and it is documented below. Allergies: Patient has No Known Allergies. Past Medical History Patient  has a past medical history of Arthritis, Blood transfusion without reported diagnosis (1996), Chronic pain, GERD (gastroesophageal reflux disease), and Hypercholesteremia. Past Surgical History Patient  has a past surgical history that includes Esophagogastroduodenoscopy (03/19/2006); Savory dilation (06/18/2011); Colonoscopy (JUNE 2013 [facility]); Tubal ligation; and Cesarean section (1986). Family History: Patient family history includes Arthritis in her mother; Cancer in her father; Colon cancer in her father; Diabetes in her brother, brother, father, and mother; GER disease in her son; Hyperlipidemia in her brother and father; Hypertension in her brother, brother, and father; Thrombosis in her mother. Social History:  Patient   reports that she has been smoking cigars. She has smoked for the past 4.00 years. She has never used smokeless tobacco. She reports current alcohol use. She reports previous drug use. Drug: Marijuana.  Review of Systems: Constitutional: negative for fever or malaise Ophthalmic: negative for photophobia, double vision or loss of vision Cardiovascular: negative for chest pain, dyspnea on exertion, or new LE swelling Respiratory: negative for SOB or persistent cough Gastrointestinal: negative for abdominal pain, change in bowel habits or melena Genitourinary: negative for dysuria or gross hematuria, no abnormal uterine bleeding or disharge Musculoskeletal: negative for new gait disturbance or muscular weakness Integumentary: negative for new or persistent rashes, no breast lumps Neurological: negative for TIA or stroke symptoms Psychiatric: negative for SI or delusions Allergic/Immunologic: negative for hives  Patient Care Team    Relationship Specialty Notifications Start End  ALeamon Arnt MD PCP - General Family Medicine  05/13/17   FDanie Binder MD (Inactive)  Gastroenterology  04/24/11   EFlorian Buff MD Consulting Physician Obstetrics and Gynecology  05/13/17  [facility]  Pain Medicine  02/18/20     Objective  Vitals: BP 128/80   Pulse 81   Temp 98 F (36.7 C) (Temporal)   Ht _0  (1.575 m)   Wt 205 lb 6.4 oz (93.2 kg)   LMP 04/18/2020   SpO2 99%   BMI 37.57 kg/m  General:  Well developed, well nourished, no acute distress  Psych:  Alert and orientedx3,normal mood and affect HEENT:  Normocephalic, atraumatic, non-icteric sclera,  supple neck without adenopathy, mass or thyromegaly Cardiovascular:  Normal S1, S2, RRR without gallop, rub or murmur Respiratory:  Good breath sounds bilaterally, CTAB with normal respiratory effort Gastrointestinal: normal bowel sounds, soft, non-tender, no noted masses. No HSM MSK: no deformities, contusions. Joints are without  erythema or swelling.  Skin:  Warm, no rashes or suspicious lesions noted Neurologic:    Mental status is normal. CN 2-11 are normal. Gross motor and sensory exams are normal. Normal gait. No tremor    Commons side effects, risks, benefits, and alternatives for medications and treatment plan prescribed today were discussed, and the patient expressed understanding of the given instructions. Patient is instructed to call or message via MyChart if he/she has any questions or concerns regarding our treatment plan. No barriers to understanding were identified. We discussed Red Flag symptoms and signs in detail. Patient expressed understanding regarding what to do in case of urgent or emergency type symptoms.   Medication list was reconciled, printed and provided to the patient in AVS. Patient instructions and summary information was reviewed with the patient as documented in the AVS. This note was prepared with assistance of Dragon voice recognition software. Occasional wrong-word or sound-a-like substitutions may have occurred due to the inherent limitations of voice recognition software  This visit occurred during the SARS-CoV-2 public health emergency.  Safety protocols were in place, including screening questions prior to the visit, additional usage of staff PPE, and extensive cleaning of exam room while observing appropriate contact time as indicated for disinfecting solutions.

## 2020-05-17 NOTE — Patient Instructions (Addendum)
Please return in 12 months for your annual complete physical; please come fasting.  I will release your lab results to you on your MyChart account with further instructions. Please reply with any questions.   Please stop the omeprazole and start the protonix daily to see if this helps with your reflux symptoms.  Call your GI doctor's office to schedule appointment for evaluation as well please.  Please schedule an eye exam.  If you have any questions or concerns, please don't hesitate to send me a message via MyChart or call the office at [phone]. Thank you for visiting with [place] today! It's our pleasure caring for you.

## 2020-05-18 LAB — HEPATITIS C ANTIBODY
Hepatitis C Ab: NONREACTIVE
SIGNAL TO CUT-OFF: 0 (ref <1.00)

## 2020-08-09 ENCOUNTER — Encounter: Payer: Self-pay | Admitting: Family Medicine

## 2020-08-09 NOTE — Telephone Encounter (Signed)
Please see pt's message.

## 2020-08-09 NOTE — Telephone Encounter (Signed)
Called patient she stated she made appt with [facility] and will call back if needed.

## 2020-09-28 ENCOUNTER — Encounter: Payer: Self-pay | Admitting: Gastroenterology
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
## 2020-09-29 ENCOUNTER — Encounter: Payer: Self-pay | Admitting: Gastroenterology

## 2020-09-29 ENCOUNTER — Ambulatory Visit (INDEPENDENT_AMBULATORY_CARE_PROVIDER_SITE_OTHER): Payer: Medicare Other | Admitting: Gastroenterology

## 2020-09-29 ENCOUNTER — Other Ambulatory Visit: Payer: Self-pay

## 2020-09-29 VITALS — BP 124/78 | HR 66 | Temp 97.1°F | Ht 62.0 in | Wt 200.4 lb

## 2020-09-29 DIAGNOSIS — K219 Gastro-esophageal reflux disease without esophagitis: Secondary | ICD-10-CM | POA: Diagnosis not present

## 2020-09-29 DIAGNOSIS — Z800 Family history of malignant neoplasm of digestive organs: Secondary | ICD-10-CM

## 2020-09-29 DIAGNOSIS — R1110 Vomiting, unspecified: Secondary | ICD-10-CM | POA: Diagnosis not present

## 2020-09-29 DIAGNOSIS — R768 Other specified abnormal immunological findings in serum: Secondary | ICD-10-CM | POA: Insufficient documentation

## 2020-09-29 DIAGNOSIS — R7689 Other specified abnormal immunological findings in serum: Secondary | ICD-10-CM | POA: Insufficient documentation

## 2020-09-29 MED ORDER — AMOXICILL-CLARITHRO-LANSOPRAZ PO MISC: Freq: Two times a day (BID) | ORAL | 0 refills | Status: DC

## 2020-09-29 NOTE — Patient Instructions (Addendum)
I suspect your regurgitation symptoms at night are likely secondary to reflux.  You may also have a bacteria in your stomach called H. pylori as your blood test was positive for this. We will go ahead and treat you for H. Pylori.  I have sent a prescription for Prevpac for 14 days which contains 2 antibiotics and an acid reflux medication to your pharmacy. You will follow the instructions on the kit you receive.  Please let us know if you have any problems getting the medication.  When you start Prevpac, stop pantoprazole.  After you complete Prevpac, resume pantoprazole 40 mg daily.   If you continue with regurgitation symptoms at night after completing Prevpac, please let me know and we can make medication adjustments.  You should also follow a GERD diet:  Avoid fried, fatty, greasy, spicy, citrus foods. Avoid caffeine and carbonated beverages. Avoid chocolate. Try eating 4-6 small meals a day rather than 3 large meals. Do not eat within 3 hours of laying down. Prop head of bed up on wood or bricks to create a 6 inch incline.   We will plan to see back in about 4 months, but do not hesitate to call if you have any questions or concerns prior to your next visit.   Aliene Altes, PA-C [facility]

## 2020-10-02 ENCOUNTER — Telehealth: Payer: Self-pay | Admitting: Internal Medicine

## 2020-10-02 NOTE — Telephone Encounter (Signed)
PA done, waiting for a response from Cover My Meds. (Dx: Helicobacter Pylori Antibody)

## 2020-10-02 NOTE — Telephone Encounter (Signed)
Medication we sent to [employer] for the patient needs prior authorization

## 2020-10-02 NOTE — Telephone Encounter (Signed)
Pt is approved for her prevpac with coverage from 01/08/20 and 10/02/2021. Faxed to the pt's [place] @ [phone]. Given to Manuela Schwartz to send to scan and advised the pt of the outcome

## 2020-12-04 ENCOUNTER — Telehealth: Payer: Self-pay

## 2020-12-04 NOTE — Telephone Encounter (Signed)
Patient declined appt, states she has been taking benadryl which has been helping and the swelling is going down.   Nurse: Duanne Limerick, RN, Macky Lower Date/Time Eilene Ghazi Time): 12/01/2020 8:42:40 AM Confirm and document reason for call. If symptomatic, describe symptoms. ---Caller states she is having an allergic reaction and her eyes are swollen. She states the reaction started yesterday. States she tried to put on eyelashes and think she is allergic to the glue  Does the patient have any new or worsening symptoms? ---Yes Will a triage be completed? ---Yes Related visit to physician within the last 2 weeks? ---No Does the PT have any chronic conditions? (i.e. diabetes, asthma, this includes High risk factors for pregnancy, etc.) --No Is the patient pregnant or possibly pregnant? (Ask all females between the ages of 74-55) ---No Is this a behavioral health or substance abuse call? ---No  Eye - Swelling  [1] SEVERE eyelid swelling (i.e., shut or almost) [2] involves both eyes [3] itchy  12/01/2020 8:20:48 AM Attempt made - no message left Stefan Church 12/01/2020 8:47:20 AM See PCP within 24 Hours Yes Duanne Limerick, Therapist, sports, Macky Lower

## 2020-12-04 NOTE — Telephone Encounter (Signed)
fyi

## 2020-12-21 ENCOUNTER — Encounter: Payer: Self-pay | Admitting: Gastroenterology

## 2021-01-01 ENCOUNTER — Other Ambulatory Visit: Payer: Self-pay | Admitting: Family Medicine

## 2021-02-02 ENCOUNTER — Encounter: Payer: Self-pay | Admitting: Obstetrics & Gynecology

## 2021-02-19 ENCOUNTER — Ambulatory Visit: Payer: Medicare Other | Admitting: Gastroenterology

## 2021-03-05 ENCOUNTER — Other Ambulatory Visit: Payer: Self-pay | Admitting: Family Medicine

## 2021-03-21 ENCOUNTER — Telehealth: Payer: Self-pay | Admitting: Family Medicine

## 2021-03-21 NOTE — Telephone Encounter (Signed)
Copied from [place] [id number]. Topic: Medicare AWV ?>> Mar 21, 2021  4:12 PM Harris-Coley, Hannah Beat wrote: ?Reason for CRM: LVM 03/21/21 appt time change from 3:15p to 1:45p.  Please confirm or r/s appt to another date. khc ?

## 2021-03-26 ENCOUNTER — Ambulatory Visit: Payer: Medicare Other

## 2021-03-26 ENCOUNTER — Ambulatory Visit (INDEPENDENT_AMBULATORY_CARE_PROVIDER_SITE_OTHER): Payer: Medicare Other

## 2021-03-26 VITALS — BP 120/80 | HR 91 | Temp 99.0°F | Wt 201.6 lb

## 2021-03-26 DIAGNOSIS — Z0000 Encounter for general adult medical examination without abnormal findings: Secondary | ICD-10-CM
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
## 2021-03-26 NOTE — Patient Instructions (Signed)
Kelsey Grant , ?Thank you for taking time to come for your Medicare Wellness Visit. I appreciate your ongoing commitment to your health goals. Please review the following plan we discussed and let me know if I can assist you in the future.  ? ?Screening recommendations/referrals: ?Colonoscopy: postponed at this time ?Mammogram: Done 04/05/20 repeat every year  ?Recommended yearly ophthalmology/optometry visit for glaucoma screening and checkup ?Recommended yearly dental visit for hygiene and checkup ? ?Vaccinations: ?Influenza vaccine: Declined  ?Tdap vaccine: Discontinued  ?Shingles vaccine: Shingrix discussed. Please contact your pharmacy for coverage information.   ?Covid-19: declined and discussed  ? ?Advanced directives: Advance directive discussed with you today. Even though you declined this today please call our office should you change your mind and we can give you the proper paperwork for you to fill out. ? ?Conditions/risks identified: None at this time  ? ?Next appointment: Follow up in one year for your annual wellness visit.  ? ?Preventive Care 40-64 Years, Female ?Preventive care refers to lifestyle choices and visits with your health care provider that can promote health and wellness. ?What does preventive care include? ?A yearly physical exam. This is also called an annual well check. ?Dental exams once or twice a year. ?Routine eye exams. Ask your health care provider how often you should have your eyes checked. ?Personal lifestyle choices, including: ?Daily care of your teeth and gums. ?Regular physical activity. ?Eating a healthy diet. ?Avoiding tobacco and drug use. ?Limiting alcohol use. ?Practicing safe sex. ?Taking low-dose aspirin daily starting at age 39. ?Taking vitamin and mineral supplements as recommended by your health care provider. ?What happens during an annual well check? ?The services and screenings done by your health care provider during your annual well check will depend on your  age, overall health, lifestyle risk factors, and family history of disease. ?Counseling  ?Your health care provider may ask you questions about your: ?Alcohol use. ?Tobacco use. ?Drug use. ?Emotional well-being. ?Home and relationship well-being. ?Sexual activity. ?Eating habits. ?Work and work Statistician. ?Method of birth control. ?Menstrual cycle. ?Pregnancy history. ?Screening  ?You may have the following tests or measurements: ?Height, weight, and BMI. ?Blood pressure. ?Lipid and cholesterol levels. These may be checked every 5 years, or more frequently if you are over 42 years old. ?Skin check. ?Lung cancer screening. You may have this screening every year starting at age 24 if you have a 30-pack-year history of smoking and currently smoke or have quit within the past 15 years. ?Fecal occult blood test (FOBT) of the stool. You may have this test every year starting at age 28. ?Flexible sigmoidoscopy or colonoscopy. You may have a sigmoidoscopy every 5 years or a colonoscopy every 10 years starting at age 56. ?Hepatitis C blood test. ?Hepatitis B blood test. ?Sexually transmitted disease (STD) testing. ?Diabetes screening. This is done by checking your blood sugar (glucose) after you have not eaten for a while (fasting). You may have this done every 1-3 years. ?Mammogram. This may be done every 1-2 years. Talk to your health care provider about when you should start having regular mammograms. This may depend on whether you have a family history of breast cancer. ?BRCA-related cancer screening. This may be done if you have a family history of breast, ovarian, tubal, or peritoneal cancers. ?Pelvic exam and Pap test. This may be done every 3 years starting at age 33. Starting at age 27, this may be done every 5 years if you have a Pap test in combination with  an HPV test. ?Bone density scan. This is done to screen for osteoporosis. You may have this scan if you are at high risk for osteoporosis. ?Discuss your test  results, treatment options, and if necessary, the need for more tests with your health care provider. ?Vaccines  ?Your health care provider may recommend certain vaccines, such as: ?Influenza vaccine. This is recommended every year. ?Tetanus, diphtheria, and acellular pertussis (Tdap, Td) vaccine. You may need a Td booster every 10 years. ?Zoster vaccine. You may need this after age 66. ?Pneumococcal 13-valent conjugate (PCV13) vaccine. You may need this if you have certain conditions and were not previously vaccinated. ?Pneumococcal polysaccharide (PPSV23) vaccine. You may need one or two doses if you smoke cigarettes or if you have certain conditions. ?Talk to your health care provider about which screenings and vaccines you need and how often you need them. ?This information is not intended to replace advice given to you by your health care provider. Make sure you discuss any questions you have with your health care provider. ?Document Released: 01/20/2015 Document Revised: 09/13/2015 Document Reviewed: 10/25/2014 ?Elsevier Interactive Patient Education ? 2017 [employer] ? ? ? ?Fall Prevention in the Home ?Falls can cause injuries. They can happen to people of all ages. There are many things you can do to make your home safe and to help prevent falls. ?What can I do on the outside of my home? ?Regularly fix the edges of walkways and driveways and fix any cracks. ?Remove anything that might make you trip as you walk through a door, such as a raised step or threshold. ?Trim any bushes or trees on the path to your home. ?Use bright outdoor lighting. ?Clear any walking paths of anything that might make someone trip, such as rocks or tools. ?Regularly check to see if handrails are loose or broken. Make sure that both sides of any steps have handrails. ?Any raised decks and porches should have guardrails on the edges. ?Have any leaves, snow, or ice cleared regularly. ?Use sand or salt on walking paths during  winter. ?Clean up any spills in your garage right away. This includes oil or grease spills. ?What can I do in the bathroom? ?Use night lights. ?Install grab bars by the toilet and in the tub and shower. Do not use towel bars as grab bars. ?Use non-skid mats or decals in the tub or shower. ?If you need to sit down in the shower, use a plastic, non-slip stool. ?Keep the floor dry. Clean up any water that spills on the floor as soon as it happens. ?Remove soap buildup in the tub or shower regularly. ?Attach bath mats securely with double-sided non-slip rug tape. ?Do not have throw rugs and other things on the floor that can make you trip. ?What can I do in the bedroom? ?Use night lights. ?Make sure that you have a light by your bed that is easy to reach. ?Do not use any sheets or blankets that are too big for your bed. They should not hang down onto the floor. ?Have a firm chair that has side arms. You can use this for support while you get dressed. ?Do not have throw rugs and other things on the floor that can make you trip. ?What can I do in the kitchen? ?Clean up any spills right away. ?Avoid walking on wet floors. ?Keep items that you use a lot in easy-to-reach places. ?If you need to reach something above you, use a strong step stool that has  a grab bar. ?Keep electrical cords out of the way. ?Do not use floor polish or wax that makes floors slippery. If you must use wax, use non-skid floor wax. ?Do not have throw rugs and other things on the floor that can make you trip. ?What can I do with my stairs? ?Do not leave any items on the stairs. ?Make sure that there are handrails on both sides of the stairs and use them. Fix handrails that are broken or loose. Make sure that handrails are as long as the stairways. ?Check any carpeting to make sure that it is firmly attached to the stairs. Fix any carpet that is loose or worn. ?Avoid having throw rugs at the top or bottom of the stairs. If you do have throw rugs,  attach them to the floor with carpet tape. ?Make sure that you have a light switch at the top of the stairs and the bottom of the stairs. If you do not have them, ask someone to add them for you. ?What else can

## 2021-03-28 ENCOUNTER — Other Ambulatory Visit (HOSPITAL_COMMUNITY): Payer: Self-pay | Admitting: Family Medicine

## 2021-03-28 DIAGNOSIS — Z1231 Encounter for screening mammogram for malignant neoplasm of breast: Secondary | ICD-10-CM

## 2021-04-09 ENCOUNTER — Ambulatory Visit (HOSPITAL_COMMUNITY)
Admission: RE | Admit: 2021-04-09 | Discharge: 2021-04-09 | Disposition: A | Discharge status: Home / Self Care | Payer: Medicare Other | Source: Ambulatory Visit | Attending: Family Medicine | Admitting: Family Medicine

## 2021-04-09 DIAGNOSIS — Z1231 Encounter for screening mammogram for malignant neoplasm of breast: Secondary | ICD-10-CM | POA: Diagnosis present

## 2021-04-13 ENCOUNTER — Other Ambulatory Visit: Payer: Self-pay | Admitting: Family Medicine

## 2021-04-15 ENCOUNTER — Other Ambulatory Visit: Payer: Self-pay | Admitting: Family Medicine

## 2021-04-16 ENCOUNTER — Other Ambulatory Visit: Payer: Medicare Other | Admitting: Obstetrics & Gynecology

## 2021-04-16 MED ORDER — PRAVASTATIN SODIUM 40 MG PO TABS: 40.0000 mg | ORAL_TABLET | Freq: Every day | ORAL | 0 refills | Status: DC
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
## 2021-04-30 ENCOUNTER — Ambulatory Visit (INDEPENDENT_AMBULATORY_CARE_PROVIDER_SITE_OTHER): Payer: Medicare Other | Admitting: Gastroenterology

## 2021-04-30 ENCOUNTER — Other Ambulatory Visit: Payer: Self-pay

## 2021-04-30 ENCOUNTER — Encounter: Payer: Self-pay | Admitting: Gastroenterology

## 2021-04-30 VITALS — BP 126/72 | HR 86 | Temp 97.7°F | Ht 62.0 in | Wt 198.2 lb

## 2021-04-30 DIAGNOSIS — Z800 Family history of malignant neoplasm of digestive organs: Secondary | ICD-10-CM

## 2021-04-30 DIAGNOSIS — Z8619 Personal history of other infectious and parasitic diseases: Secondary | ICD-10-CM | POA: Insufficient documentation

## 2021-04-30 DIAGNOSIS — K219 Gastro-esophageal reflux disease without esophagitis: Secondary | ICD-10-CM | POA: Diagnosis not present

## 2021-04-30 MED ORDER — PEG 3350-KCL-NA BICARB-NACL 420 G PO SOLR: 4000.0000 mL | ORAL | 0 refills | Status: DC

## 2021-04-30 NOTE — Patient Instructions (Addendum)
Hold your pantoprazole for the next 14 days, then complete H. pylori breath test.  The earliest you should complete H. pylori breath test is on 05/15/2021.  ?During this 2-week period, you may take Pepcid 20 mg as needed, but do not take Pepcid within 48 hours of completing the H. pylori breath test. ?Otherwise, during this 2-week period, do not take any other over-the-counter acid suppression medications, Pepto-Bismol, or antibiotics. ? ?I will have further recommendations for you regarding your acid reflux symptoms after the H. pylori breath test is completed. ? ?We will arrange for you to have a colonoscopy in the near future with Dr. Abbey Chatters. ? ?It was good to see you again today!  ? ?Aliene Altes, PA-C ?[facility] ? ?

## 2021-05-01 ENCOUNTER — Other Ambulatory Visit: Payer: Medicare Other | Admitting: Obstetrics & Gynecology

## 2021-05-09 ENCOUNTER — Telehealth: Payer: Self-pay | Admitting: Internal Medicine

## 2021-05-09 NOTE — Telephone Encounter (Signed)
Spoke to pt, informed her that she can go anytime May 9 to have breathing test done.  ?

## 2021-05-09 NOTE — Telephone Encounter (Signed)
Agree with recommendations.  May 9th was the earliest that she could complete her H. pylori breath test.  She should be holding her acid reflux medication for 14 days before completing the test.  During this time she was also to be voiding Pepto-Bismol, other over-the-counter antireflux medications, and antibiotics. ?

## 2021-05-09 NOTE — Telephone Encounter (Signed)
Please call patient , she said that she was supposed to have a "breathing test" and she did not know what time to be there  ?

## 2021-05-09 NOTE — Telephone Encounter (Signed)
Pt is to have H. pylori breath test, [facility] clinical pool not involved. ?

## 2021-05-11 ENCOUNTER — Encounter: Payer: Self-pay | Admitting: Obstetrics & Gynecology

## 2021-05-11 ENCOUNTER — Ambulatory Visit (INDEPENDENT_AMBULATORY_CARE_PROVIDER_SITE_OTHER): Payer: Medicare Other | Admitting: Obstetrics & Gynecology

## 2021-05-11 VITALS — BP 140/81 | HR 82 | Ht 62.0 in | Wt 200.0 lb

## 2021-05-11 DIAGNOSIS — Z1211 Encounter for screening for malignant neoplasm of colon: Secondary | ICD-10-CM | POA: Diagnosis not present

## 2021-05-11 DIAGNOSIS — Z1212 Encounter for screening for malignant neoplasm of rectum: Secondary | ICD-10-CM

## 2021-05-11 DIAGNOSIS — Z01419 Encounter for gynecological examination (general) (routine) without abnormal findings: Secondary | ICD-10-CM | POA: Diagnosis not present
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
## 2021-05-11 NOTE — Telephone Encounter (Signed)
Spoke to pt, informed pt of recommendations.  ?

## 2021-05-15 ENCOUNTER — Other Ambulatory Visit: Payer: Self-pay | Admitting: Family Medicine

## 2021-05-18 ENCOUNTER — Encounter: Payer: Self-pay | Admitting: Family Medicine

## 2021-05-18 ENCOUNTER — Ambulatory Visit (INDEPENDENT_AMBULATORY_CARE_PROVIDER_SITE_OTHER): Payer: Medicare Other | Admitting: Family Medicine

## 2021-05-18 VITALS — BP 130/82 | HR 85 | Temp 98.4°F | Ht 62.0 in | Wt 198.2 lb

## 2021-05-18 DIAGNOSIS — F1190 Opioid use, unspecified, uncomplicated: Secondary | ICD-10-CM | POA: Diagnosis not present

## 2021-05-18 DIAGNOSIS — R739 Hyperglycemia, unspecified: Secondary | ICD-10-CM

## 2021-05-18 DIAGNOSIS — R030 Elevated blood-pressure reading, without diagnosis of hypertension: Secondary | ICD-10-CM | POA: Diagnosis not present

## 2021-05-18 DIAGNOSIS — K219 Gastro-esophageal reflux disease without esophagitis: Secondary | ICD-10-CM

## 2021-05-18 DIAGNOSIS — G894 Chronic pain syndrome: Secondary | ICD-10-CM

## 2021-05-18 DIAGNOSIS — Z800 Family history of malignant neoplasm of digestive organs: Secondary | ICD-10-CM

## 2021-05-18 DIAGNOSIS — E782 Mixed hyperlipidemia: Secondary | ICD-10-CM

## 2021-05-18 DIAGNOSIS — E669 Obesity, unspecified: Secondary | ICD-10-CM

## 2021-05-18 LAB — COMPREHENSIVE METABOLIC PANEL
ALT: 10 U/L (ref 0–35)
AST: 13 U/L (ref 0–37)
Albumin: 4.2 g/dL (ref 3.5–5.2)
Alkaline Phosphatase: 86 U/L (ref 39–117)
BUN: 13 mg/dL (ref 6–23)
CO2: 26 mEq/L (ref 19–32)
Calcium: 9.1 mg/dL (ref 8.4–10.5)
Chloride: 109 mEq/L (ref 96–112)
Creatinine, Ser: 0.81 mg/dL (ref 0.40–1.20)
GFR: 82.47 mL/min (ref >60.00)
Glucose, Bld: 87 mg/dL (ref 70–99)
Potassium: 3.6 mEq/L (ref 3.5–5.1)
Sodium: 142 mEq/L (ref 135–145)
Total Bilirubin: 0.5 mg/dL (ref 0.2–1.2)
Total Protein: 6.3 g/dL (ref 6.0–8.3)

## 2021-05-18 LAB — CBC WITH DIFFERENTIAL/PLATELET
Basophils Absolute: 0 10*3/uL (ref 0.0–0.1)
Basophils Relative: 0.6 % (ref 0.0–3.0)
Eosinophils Absolute: 0.1 10*3/uL (ref 0.0–0.7)
Eosinophils Relative: 1.2 % (ref 0.0–5.0)
HCT: 40 % (ref 36.0–46.0)
Hemoglobin: 13.3 g/dL (ref 12.0–15.0)
Lymphocytes Relative: 50.4 % — ABNORMAL HIGH (ref 12.0–46.0)
Lymphs Abs: 2.7 10*3/uL (ref 0.7–4.0)
MCHC: 33.2 g/dL (ref 30.0–36.0)
MCV: 93.3 fl (ref 78.0–100.0)
Monocytes Absolute: 0.4 10*3/uL (ref 0.1–1.0)
Monocytes Relative: 8 % (ref 3.0–12.0)
Neutro Abs: 2.2 10*3/uL (ref 1.4–7.7)
Neutrophils Relative %: 39.8 % — ABNORMAL LOW (ref 43.0–77.0)
Platelets: 253 10*3/uL (ref 150.0–400.0)
RBC: 4.29 Mil/uL (ref 3.87–5.11)
RDW: 13.4 % (ref 11.5–15.5)
WBC: 5.4 10*3/uL (ref 4.0–10.5)

## 2021-05-18 LAB — LIPID PANEL
Cholesterol: 144 mg/dL (ref 0–200)
HDL: 49.6 mg/dL (ref >39.00)
LDL Cholesterol: 77 mg/dL (ref 0–99)
NonHDL: 94.44
Total CHOL/HDL Ratio: 3
Triglycerides: 89 mg/dL (ref 0.0–149.0)
VLDL: 17.8 mg/dL (ref 0.0–40.0)

## 2021-05-18 LAB — HEMOGLOBIN A1C: Hgb A1c MFr Bld: 5.8 % (ref 4.6–6.5)

## 2021-05-18 LAB — TSH: TSH: 1.15 u[IU]/mL (ref 0.35–5.50)

## 2021-05-18 NOTE — Patient Instructions (Signed)

## 2021-05-18 NOTE — Progress Notes (Signed)
?Subjective  ?Chief Complaint  ?Patient presents with  ? Annual Exam  ?  Not fasting  ? ? ?HPI: Kelsey Grant is a 56 y.o. female who presents to [facility] today for a Female Wellness Visit. She also has the concerns and/or needs as listed above in the chief complaint. These will be addressed in addition to the Health Maintenance Visit.  ? ?Wellness Visit: annual visit with health maintenance review and exam without Pap ? ?Health maintenance: Screens are current.  She is due for colonoscopy and this is scheduled for later this month.  She declines Shingrix vaccination today but will consider in the future.  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  Other immunizations are current.  Pap smear and mammogram are current. ?Chronic disease f/u and/or acute problem visit: (deemed necessary to be done in addition to the wellness visit): ?Hyperlipidemia on pravastatin 40 nightly.  Well-tolerated.  Due for fasting recheck.  Goal LDL less than 100. ?Chronic pain and chronic narcotics: Complains of shoulder and back pain today.  Managed by pain clinic ?History of prehypertension.  Blood pressure normal today ?GERD: Currently active.  On chronic PPI.  She is seeing GI, scheduled for H. pylori breath test to see if this has been cured.  Was diagnosed as positive last year.  Has not had recent endoscopy.  No dysphagia symptoms. ?History of IFG: No symptoms of hyperglycemia now. ? ?Assessment  ?1. Mixed hyperlipidemia   ?2. Prehypertension   ?3. Chronic pain syndrome   ?4. Chronic narcotic use   ?5. Family history of colon cancer in father   ?6. Obesity (BMI 30-39.9)   ?7. Gastroesophageal reflux disease, unspecified whether esophagitis present   ?8. Hyperglycemia, unspecified   ? ?  ?Plan  ?Female Wellness Visit: ?Age appropriate Health Maintenance and Prevention measures were discussed with patient. Included topics are cancer screening recommendations, ways to keep healthy (see AVS) including dietary  and exercise recommendations, regular eye and dental care, use of seat belts, and avoidance of moderate alcohol use and tobacco use.  ?BMI: discussed patient's BMI and encouraged positive lifestyle modifications to help get to or maintain a target BMI. ?HM needs and immunizations were addressed and ordered. See below for orders. See HM and immunization section for updates. ?Routine labs and screening tests ordered including cmp, cbc and lipids where appropriate. ?Discussed recommendations regarding Vit D and calcium supplementation (see AVS) ? ?Chronic disease management visit and/or acute problem visit: ?Recheck lipids on statin pravastatin 40 nightly.  Check LFTs.  Adjust dose if needed ?Chronic pain on chronic narcotics, following along ?For colonoscopy due to family history of colon cancer.  This is scheduled. ?GERD: Currently active.  She will follow-up with GI.  May need upper endoscopy again.  Continue Protonix daily ?Screen for diabetes given history of hypoglycemia ?Remains normotensive. ? ?Follow up: 1 year for complete physical ?Orders Placed This Encounter  ?Procedures  ? CBC with Differential/Platelet  ? Comprehensive metabolic panel  ? Lipid panel  ? Hemoglobin A1c  ? TSH  ? ?No orders of the defined types were placed in this encounter. ? ?  ? ?Body mass index is 36.26 kg/m?. ?Wt Readings from Last 3 Encounters:  ?05/18/21 198 lb 4 oz (89.9 kg)  ?05/11/21 200 lb (90.7 kg)  ?04/30/21 198 lb 3.2 oz (89.9 kg)  ? ? ? ?Patient Active Problem List  ? Diagnosis Date Noted  ? Prehypertension 04/16/2019  ?  Priority: High  ? Obesity (  BMI 30-39.9) 02/17/2019  ?  Priority: High  ? Chronic pain syndrome 05/13/2017  ?  Priority: High  ? Chronic narcotic use 05/13/2017  ?  Priority: High  ? Mixed hyperlipidemia 05/13/2017  ?  Priority: High  ? Localized osteoarthrosis of left hip 05/13/2017  ?  Priority: High  ? Degenerative joint disease (DJD) of lumbar spine 05/13/2017  ?  Priority: High  ? Family history of colon  cancer in father 04/25/2011  ?  Priority: High  ?  Father, 16s ? ?  ? Gastroesophageal reflux disease 10/16/2011  ?  Priority: Medium   ? Esophageal stricture 04/25/2011  ?  Priority: Medium   ?  Egd/dil jun 2013 ? ?  ? Cigar smoker unmotivated to quit 02/18/2020  ?  Priority: Low  ?  occasional cigar ? ?  ? Metatarsalgia of both feet 02/12/2018  ?  Priority: Low  ? History of Helicobacter pylori infection 04/30/2021  ? Regurgitation of food 09/29/2020  ? Helicobacter pylori antibody positive 09/29/2020  ? No vaccination-pt refuse 02/18/2020  ? ?Health Maintenance  ?Topic Date Due  ? Zoster Vaccines- Shingrix (1 of 2) 08/18/2021 (Originally 04/02/2017)  ? COLONOSCOPY (Pts 45-64yr Insurance coverage will need to be confirmed)  03/27/2022 (Originally 03/07/2021)  ? MAMMOGRAM  04/10/2022  ? PAP SMEAR-Modifier  04/13/2025  ? Hepatitis C Screening  Completed  ? HIV Screening  Completed  ? HPV VACCINES  Aged Out  ? INFLUENZA VACCINE  Discontinued  ? TETANUS/TDAP  Discontinued  ? COVID-19 Vaccine  Discontinued  ? ? ?There is no immunization history on file for this patient. ?We updated and reviewed the patient's past history in detail and it is documented below. ?Allergies: ?Patient has No Known Allergies. ?Past Medical History ?Patient  has a past medical history of Arthritis, Blood transfusion without reported diagnosis (1996), Chronic pain, GERD (gastroesophageal reflux disease), and Hypercholesteremia. ?Past Surgical History ?Patient  has a past surgical history that includes Esophagogastroduodenoscopy (03/19/2006); Savory dilation (06/18/2011); Colonoscopy (06/2011); Tubal ligation; Cesarean section (1986); Esophagogastroduodenoscopy (05/2011); and Esophagogastroduodenoscopy (06/2011). ?Family History: ?Patient family history includes Arthritis in her mother; Cancer in her father; Colon cancer in her father; Diabetes in her brother, brother, father, and mother; GER disease in her son; Hyperlipidemia in her brother and  father; Hypertension in her brother, brother, and father; Thrombosis in her mother. ?Social History:  ?Patient  reports that she has been smoking cigars. She has never used smokeless tobacco. She reports current alcohol use. She reports that she does not currently use drugs after having used the following drugs: Marijuana. ? ?Review of Systems: ?Constitutional: negative for fever or malaise ?Ophthalmic: negative for photophobia, double vision or loss of vision ?Cardiovascular: negative for chest pain, dyspnea on exertion, or new LE swelling ?Respiratory: negative for SOB or persistent cough ?Gastrointestinal: negative for abdominal pain, change in bowel habits or melena ?Genitourinary: negative for dysuria or gross hematuria, no abnormal uterine bleeding or disharge ?Musculoskeletal: negative for new gait disturbance or muscular weakness ?Integumentary: negative for new or persistent rashes, no breast lumps ?Neurological: negative for TIA or stroke symptoms ?Psychiatric: negative for SI or delusions ?Allergic/Immunologic: negative for hives ? ?Patient Care Team  ?  Relationship Specialty Notifications Start End  ?ALeamon Arnt MD PCP - General Family Medicine  05/13/17   ?EFlorian Buff MD Consulting Physician Obstetrics and Gynecology  05/13/17   ?[facility]  02/18/20   ?CEloise Harman DO Consulting Physician Gastroenterology  10/02/20   ? ? ?  Objective  ?Vitals: BP 130/82   Pulse 85   Temp 98.4 ?F (36.9 ?C) (Temporal)   Ht '5\' 2"'$  (1.575 m)   Wt 198 lb 4 oz (89.9 kg)   LMP 03/28/2021 (Approximate)   SpO2 96%   BMI 36.26 kg/m?  ?General:  Well developed, well nourished, no acute distress  ?Psych:  Alert and orientedx3,normal mood and affect ?HEENT:  Normocephalic, atraumatic, non-icteric sclera,  supple neck without adenopathy, mass or thyromegaly ?Cardiovascular:  Normal S1, S2, RRR without gallop, rub or murmur ?Respiratory:  Good breath sounds bilaterally, CTAB with normal  respiratory effort ?Gastrointestinal: normal bowel sounds, soft, non-tender, no noted masses. No HSM ?MSK: no deformities, contusions. Joints are without erythema or swelling.  ?Skin:  Warm, no rashes or suspiciou

## 2021-05-24 ENCOUNTER — Other Ambulatory Visit: Payer: Self-pay | Admitting: Gastroenterology

## 2021-05-24 DIAGNOSIS — K219 Gastro-esophageal reflux disease without esophagitis: Secondary | ICD-10-CM

## 2021-05-24 LAB — H. PYLORI BREATH TEST: H. pylori Breath Test: NOT DETECTED

## 2021-05-24 MED ORDER — PANTOPRAZOLE SODIUM 40 MG PO TBEC: 40.0000 mg | DELAYED_RELEASE_TABLET | Freq: Two times a day (BID) | ORAL | 3 refills | Status: DC

## 2021-05-31 ENCOUNTER — Other Ambulatory Visit (HOSPITAL_COMMUNITY)
Admission: RE | Admit: 2021-05-31 | Discharge: 2021-05-31 | Disposition: A | Discharge status: Home / Self Care | Payer: Medicare Other | Source: Ambulatory Visit | Attending: Internal Medicine | Admitting: Internal Medicine

## 2021-05-31 ENCOUNTER — Telehealth: Payer: Self-pay

## 2021-05-31 DIAGNOSIS — Z3200 Encounter for pregnancy test, result unknown: Secondary | ICD-10-CM | POA: Insufficient documentation

## 2021-05-31 DIAGNOSIS — Z800 Family history of malignant neoplasm of digestive organs: Secondary | ICD-10-CM | POA: Insufficient documentation

## 2021-05-31 LAB — PREGNANCY, URINE: Preg Test, Ur: NEGATIVE

## 2021-05-31 NOTE — Telephone Encounter (Signed)
Melanie at [facility] said pt hasn't done pregnancy test for tomorrow's procedure.  Called pt, she is at hospital now waiting for lab. She thought she had to pick prep up at office. Advised her rx was sent to pharmacy and she has to pick up there.

## 2021-06-01 ENCOUNTER — Other Ambulatory Visit: Payer: Self-pay

## 2021-06-01 ENCOUNTER — Ambulatory Visit (HOSPITAL_COMMUNITY)
Admission: RE | Admit: 2021-06-01 | Discharge: 2021-06-01 | Disposition: A | Discharge status: Home / Self Care | Payer: Medicare Other | Attending: Internal Medicine | Admitting: Internal Medicine

## 2021-06-01 ENCOUNTER — Encounter (HOSPITAL_COMMUNITY)
Admission: RE | Disposition: A | Discharge status: Home / Self Care | Payer: Self-pay | Source: Home / Self Care | Attending: Internal Medicine

## 2021-06-01 ENCOUNTER — Encounter (HOSPITAL_COMMUNITY): Payer: Self-pay

## 2021-06-01 ENCOUNTER — Ambulatory Visit (HOSPITAL_COMMUNITY): Payer: Medicare Other | Admitting: Anesthesiology

## 2021-06-01 ENCOUNTER — Ambulatory Visit (HOSPITAL_BASED_OUTPATIENT_CLINIC_OR_DEPARTMENT_OTHER): Payer: Medicare Other | Admitting: Anesthesiology

## 2021-06-01 DIAGNOSIS — F1190 Opioid use, unspecified, uncomplicated: Secondary | ICD-10-CM

## 2021-06-01 DIAGNOSIS — K635 Polyp of colon: Secondary | ICD-10-CM

## 2021-06-01 DIAGNOSIS — Z800 Family history of malignant neoplasm of digestive organs: Secondary | ICD-10-CM

## 2021-06-01 DIAGNOSIS — G894 Chronic pain syndrome: Secondary | ICD-10-CM

## 2021-06-01 DIAGNOSIS — K648 Other hemorrhoids: Secondary | ICD-10-CM

## 2021-06-01 DIAGNOSIS — E782 Mixed hyperlipidemia: Secondary | ICD-10-CM

## 2021-06-01 DIAGNOSIS — Z2821 Immunization not carried out because of patient refusal: Secondary | ICD-10-CM

## 2021-06-01 DIAGNOSIS — E669 Obesity, unspecified: Secondary | ICD-10-CM

## 2021-06-01 DIAGNOSIS — M1612 Unilateral primary osteoarthritis, left hip: Secondary | ICD-10-CM

## 2021-06-01 DIAGNOSIS — F17290 Nicotine dependence, other tobacco product, uncomplicated: Secondary | ICD-10-CM | POA: Diagnosis not present

## 2021-06-01 DIAGNOSIS — K5730 Diverticulosis of large intestine without perforation or abscess without bleeding: Secondary | ICD-10-CM | POA: Insufficient documentation

## 2021-06-01 DIAGNOSIS — R030 Elevated blood-pressure reading, without diagnosis of hypertension: Secondary | ICD-10-CM

## 2021-06-01 DIAGNOSIS — Z1211 Encounter for screening for malignant neoplasm of colon: Secondary | ICD-10-CM | POA: Diagnosis present

## 2021-06-01 DIAGNOSIS — M7741 Metatarsalgia, right foot: Secondary | ICD-10-CM

## 2021-06-01 DIAGNOSIS — D125 Benign neoplasm of sigmoid colon: Secondary | ICD-10-CM | POA: Diagnosis not present

## 2021-06-01 DIAGNOSIS — K219 Gastro-esophageal reflux disease without esophagitis: Secondary | ICD-10-CM | POA: Diagnosis not present

## 2021-06-01 DIAGNOSIS — K222 Esophageal obstruction: Secondary | ICD-10-CM

## 2021-06-01 DIAGNOSIS — M47816 Spondylosis without myelopathy or radiculopathy, lumbar region: Secondary | ICD-10-CM

## 2021-06-01 DIAGNOSIS — M1990 Unspecified osteoarthritis, unspecified site: Secondary | ICD-10-CM | POA: Insufficient documentation

## 2021-06-01 DIAGNOSIS — R768 Other specified abnormal immunological findings in serum: Secondary | ICD-10-CM

## 2021-06-01 DIAGNOSIS — Z8619 Personal history of other infectious and parasitic diseases: Secondary | ICD-10-CM

## 2021-06-01 DIAGNOSIS — R1110 Vomiting, unspecified: Secondary | ICD-10-CM

## 2021-06-01 HISTORY — PX: POLYPECTOMY: SHX5525

## 2021-06-01 HISTORY — PX: COLONOSCOPY WITH PROPOFOL: SHX5780

## 2021-06-01 SURGERY — COLONOSCOPY WITH PROPOFOL
Anesthesia: General

## 2021-06-01 MED ORDER — LIDOCAINE HCL (CARDIAC) PF 100 MG/5ML IV SOSY
PREFILLED_SYRINGE | INTRAVENOUS | Status: DC | PRN
  Administered 2021-06-01: 50 mg via INTRAVENOUS

## 2021-06-01 MED ORDER — PROPOFOL 10 MG/ML IV BOLUS
INTRAVENOUS | Status: DC | PRN
  Administered 2021-06-01: 100 mg via INTRAVENOUS
  Administered 2021-06-01 (×3): 50 mg via INTRAVENOUS
  Administered 2021-06-01: 100 mg via INTRAVENOUS

## 2021-06-01 MED ORDER — LACTATED RINGERS IV SOLN: INTRAVENOUS | Status: DC

## 2021-06-01 NOTE — Transfer of Care (Signed)
Immediate Anesthesia Transfer of Care Note  Patient: Kelsey Grant  Procedure(s) Performed: COLONOSCOPY WITH PROPOFOL POLYPECTOMY  Patient Location: Endoscopy Unit  Anesthesia Type:General  Level of Consciousness: drowsy  Airway & Oxygen Therapy: Patient Spontanous Breathing  Post-op Assessment: Report given to RN and Post -op Vital signs reviewed and stable  Post vital signs: Reviewed and stable  Last Vitals:  Vitals Value Taken Time  BP    Temp    Pulse    Resp    SpO2      Last Pain:  Vitals:   06/01/21 1319  TempSrc:   PainSc: 6       Patients Stated Pain Goal: 6 (91/66/06 0045)  Complications: No notable events documented.

## 2021-06-01 NOTE — Anesthesia Preprocedure Evaluation (Signed)
Anesthesia Evaluation  Patient identified by MRN, date of birth, ID band Patient awake    Reviewed: Allergy & Precautions, NPO status , Patient's Chart, lab work & pertinent test results  Airway Mallampati: II  TM Distance: >3 FB Neck ROM: Full    Dental  (+) Dental Advisory Given, Chipped,    Pulmonary Current Smoker and Patient abstained from smoking.,    Pulmonary exam normal breath sounds clear to auscultation       Cardiovascular negative cardio ROS Normal cardiovascular exam Rhythm:Regular Rate:Normal     Neuro/Psych negative neurological ROS  negative psych ROS   GI/Hepatic Neg liver ROS, GERD  Medicated,  Endo/Other  negative endocrine ROS  Renal/GU negative Renal ROS  negative genitourinary   Musculoskeletal  (+) Arthritis , Osteoarthritis,  narcotic dependent  Abdominal   Peds negative pediatric ROS (+)  Hematology negative hematology ROS (+)   Anesthesia Other Findings   Reproductive/Obstetrics negative OB ROS                            Anesthesia Physical Anesthesia Plan  ASA: 2  Anesthesia Plan: General   Post-op Pain Management: Minimal or no pain anticipated   Induction: Intravenous  PONV Risk Score and Plan: Propofol infusion  Airway Management Planned: Nasal Cannula and Natural Airway  Additional Equipment:   Intra-op Plan:   Post-operative Plan:   Informed Consent: I have reviewed the patients History and Physical, chart, labs and discussed the procedure including the risks, benefits and alternatives for the proposed anesthesia with the patient or authorized representative who has indicated his/her understanding and acceptance.     Dental advisory given  Plan Discussed with: CRNA and Surgeon  Anesthesia Plan Comments:         Anesthesia Quick Evaluation

## 2021-06-01 NOTE — Discharge Instructions (Addendum)
  Colonoscopy Discharge Instructions  Read the instructions outlined below and refer to this sheet in the next few weeks. These discharge instructions provide you with general information on caring for yourself after you leave the hospital. Your doctor may also give you specific instructions. While your treatment has been planned according to the most current medical practices available, unavoidable complications occasionally occur.   ACTIVITY You may resume your regular activity, but move at a slower pace for the next 24 hours.  Take frequent rest periods for the next 24 hours.  Walking will help get rid of the air and reduce the bloated feeling in your belly (abdomen).  No driving for 24 hours (because of the medicine (anesthesia) used during the test).   Do not sign any important legal documents or operate any machinery for 24 hours (because of the anesthesia used during the test).  NUTRITION Drink plenty of fluids.  You may resume your normal diet as instructed by your doctor.  Begin with a light meal and progress to your normal diet. Heavy or fried foods are harder to digest and may make you feel sick to your stomach (nauseated).  Avoid alcoholic beverages for 24 hours or as instructed.  MEDICATIONS You may resume your normal medications unless your doctor tells you otherwise.  WHAT YOU CAN EXPECT TODAY Some feelings of bloating in the abdomen.  Passage of more gas than usual.  Spotting of blood in your stool or on the toilet paper.  IF YOU HAD POLYPS REMOVED DURING THE COLONOSCOPY: No aspirin products for 7 days or as instructed.  No alcohol for 7 days or as instructed.  Eat a soft diet for the next 24 hours.  FINDING OUT THE RESULTS OF YOUR TEST Not all test results are available during your visit. If your test results are not back during the visit, make an appointment with your caregiver to find out the results. Do not assume everything is normal if you have not heard from your  caregiver or the medical facility. It is important for you to follow up on all of your test results.  SEEK IMMEDIATE MEDICAL ATTENTION IF: You have more than a spotting of blood in your stool.  Your belly is swollen (abdominal distention).  You are nauseated or vomiting.  You have a temperature over 101.  You have abdominal pain or discomfort that is severe or gets worse throughout the day.   Your colonoscopy revealed 1 polyp(s) which I removed successfully. Await pathology results, my office will contact you. I recommend repeating colonoscopy in 5 years for surveillance purposes. You also have diverticulosis and internal hemorrhoids. I would recommend increasing fiber in your diet or adding OTC Benefiber/Metamucil. Be sure to drink at least 4 to 6 glasses of water daily. Follow-up with GI 4-6 months. Call office to schedule a follow up appointment in 4 months with [facility].     I hope you have a great rest of your week!  Elon Alas. Abbey Chatters, D.O. Gastroenterology and Hepatology [facility]

## 2021-06-01 NOTE — Anesthesia Postprocedure Evaluation (Signed)
Anesthesia Post Note  Patient: Kelsey Grant  Procedure(s) Performed: COLONOSCOPY WITH PROPOFOL POLYPECTOMY  Patient location during evaluation: Phase II Anesthesia Type: General Level of consciousness: awake and alert and oriented Pain management: pain level controlled Vital Signs Assessment: post-procedure vital signs reviewed and stable Respiratory status: spontaneous breathing, nonlabored ventilation and respiratory function stable Cardiovascular status: blood pressure returned to baseline and stable Postop Assessment: no apparent nausea or vomiting Anesthetic complications: no   No notable events documented.   Last Vitals:  Vitals:   06/01/21 1308 06/01/21 1340  BP: 100/85 (!) 94/49  Pulse: 80   Resp: 20 20  Temp: 37 C 36.4 C  SpO2: 98% 97%    Last Pain:  Vitals:   06/01/21 1346  TempSrc:   PainSc: 0-No pain                 Latron Ribas C Taylar Hartsough
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
## 2021-06-01 NOTE — Anesthesia Procedure Notes (Signed)
Date/Time: 06/01/2021 1:23 PM Performed by: Orlie Dakin, CRNA Pre-anesthesia Checklist: Patient identified, Emergency Drugs available, Suction available and Patient being monitored Patient Re-evaluated:Patient Re-evaluated prior to induction Oxygen Delivery Method: Nasal cannula Induction Type: IV induction Placement Confirmation: positive ETCO2

## 2021-06-01 NOTE — Op Note (Signed)
[facility] Patient Name: Kelsey Grant Procedure Date: 06/01/2021 12:56 PM MRN: [id number] Date of Birth: [birth year] Attending MD: Elon Alas. Abbey Chatters DO CSN: [id number] Age: 56 Admit Type: Outpatient Procedure:                Colonoscopy Indications:              Screening in patient at increased risk: Colorectal                            cancer in father before age 34 Providers:                Elon Alas. Abbey Chatters, DO, Lambert Mody, Dereck Leep, Technician Referring MD:              Medicines:                See the Anesthesia note for documentation of the                            administered medications Complications:            No immediate complications. Estimated Blood Loss:     Estimated blood loss was minimal. Procedure:                Pre-Anesthesia Assessment:                           - The anesthesia plan was to use monitored                            anesthesia care (MAC).                           After obtaining informed consent, the colonoscope                            was passed under direct vision. Throughout the                            procedure, the patient's blood pressure, pulse, and                            oxygen saturations were monitored continuously. The                            [id number]-HQ190L ([id number]) scope was introduced through                            the anus and advanced to the the cecum, identified                            by appendiceal orifice and ileocecal valve. The                            colonoscopy was performed  without difficulty. The                            patient tolerated the procedure well. The quality                            of the bowel preparation was evaluated using the                            BBPS [facility] Bowel Preparation Scale) with scores                            of: Right Colon = 3, Transverse Colon = 3 and Left                            Colon = 3  (entire mucosa seen well with no residual                            staining, small fragments of stool or opaque                            liquid). The total BBPS score equals 9. Scope In: 1:22:20 PM Scope Out: 1:37:36 PM Scope Withdrawal Time: 0 hours 12 minutes 22 seconds  Total Procedure Duration: 0 hours 15 minutes 16 seconds  Findings:      The perianal and digital rectal examinations were normal.      Non-bleeding internal hemorrhoids were found during endoscopy.      Multiple small and large-mouthed diverticula were found in the sigmoid       colon.      A 4 mm polyp was found in the sigmoid colon. The polyp was sessile. The       polyp was removed with a cold snare. Resection and retrieval were       complete.      The exam was otherwise without abnormality. Impression:               - Non-bleeding internal hemorrhoids.                           - Diverticulosis in the sigmoid colon.                           - One 4 mm polyp in the sigmoid colon, removed with                            a cold snare. Resected and retrieved.                           - The examination was otherwise normal. Moderate Sedation:      Per Anesthesia Care Recommendation:           - Patient has a contact number available for                            emergencies. The signs and symptoms of potential  delayed complications were discussed with the                            patient. Return to normal activities tomorrow.                            Written discharge instructions were provided to the                            patient.                           - Resume previous diet.                           - Continue present medications.                           - Await pathology results.                           - Repeat colonoscopy in 5 years for surveillance                            and family history of colon cancer                           - Return to GI clinic  in 4 months. Procedure Code(s):        --- Professional ---                           [phone], Colonoscopy, flexible; with removal of                            tumor(s), polyp(s), or other lesion(s) by snare                            technique Diagnosis Code(s):        --- Professional ---                           Z80.0, Family history of malignant neoplasm of                            digestive organs                           K64.8, Other hemorrhoids                           K63.5, Polyp of colon                           K57.30, Diverticulosis of large intestine without                            perforation or abscess without bleeding CPT copyright 2019 American  Medical Association. All rights reserved. The codes documented in this report are preliminary and upon coder review may  be revised to meet current compliance requirements. Elon Alas. Abbey Chatters, DO Will[place] Abbey Chatters, DO 06/01/2021 1:42:19 PM This report has been signed electronically. Number of Addenda: 0

## 2021-06-05 LAB — SURGICAL PATHOLOGY

## 2021-06-07 ENCOUNTER — Encounter (HOSPITAL_COMMUNITY): Payer: Self-pay | Admitting: Internal Medicine

## 2021-06-10 ENCOUNTER — Other Ambulatory Visit: Payer: Self-pay | Admitting: Family Medicine

## 2021-06-11 ENCOUNTER — Other Ambulatory Visit: Payer: Self-pay | Admitting: Obstetrics & Gynecology

## 2021-06-11 ENCOUNTER — Other Ambulatory Visit: Payer: Self-pay | Admitting: Family Medicine

## 2021-06-11 NOTE — Telephone Encounter (Signed)
Last done by historical provider  

## 2021-06-19 ENCOUNTER — Encounter: Payer: Self-pay | Admitting: Obstetrics & Gynecology

## 2021-08-09 ENCOUNTER — Other Ambulatory Visit: Payer: Self-pay | Admitting: Family Medicine

## 2021-09-14 ENCOUNTER — Other Ambulatory Visit: Payer: Self-pay | Admitting: Gastroenterology

## 2021-09-14 DIAGNOSIS — K219 Gastro-esophageal reflux disease without esophagitis: Secondary | ICD-10-CM

## 2021-11-28 ENCOUNTER — Encounter: Payer: Self-pay | Admitting: Family Medicine

## 2021-11-28 ENCOUNTER — Ambulatory Visit (INDEPENDENT_AMBULATORY_CARE_PROVIDER_SITE_OTHER): Payer: Medicare Other | Admitting: Family Medicine

## 2021-11-28 VITALS — BP 142/90 | HR 94 | Temp 97.7°F | Ht 62.0 in | Wt 204.0 lb

## 2021-11-28 DIAGNOSIS — R030 Elevated blood-pressure reading, without diagnosis of hypertension: Secondary | ICD-10-CM | POA: Diagnosis not present

## 2021-11-28 DIAGNOSIS — K222 Esophageal obstruction: Secondary | ICD-10-CM

## 2021-11-28 DIAGNOSIS — K219 Gastro-esophageal reflux disease without esophagitis: Secondary | ICD-10-CM

## 2021-11-28 MED ORDER — PANTOPRAZOLE SODIUM 40 MG PO TBEC: 40.0000 mg | DELAYED_RELEASE_TABLET | Freq: Two times a day (BID) | ORAL | 0 refills | Status: DC

## 2021-11-28 NOTE — Patient Instructions (Signed)
Please return in May 2024 for your annual complete physical; please come fasting.   Please see your GI doctor to further address your symptoms to see if it needs further evaluation or different medications.   If you have any questions or concerns, please don't hesitate to send me a message via MyChart or call the office at [phone]. Thank you for visiting with [place] today! It's our pleasure caring for you.

## 2021-11-28 NOTE — Progress Notes (Unsigned)
Subjective  CC:  Chief Complaint  Patient presents with   Gastroesophageal Reflux    Pt stated that she has been dealing with acid reflux for the past 62mo. Water is coming up out her nose in her sleep    HPI: Kelsey FRIELINGis a 56y.o. female who presents to the office today to address the problems listed above in the chief complaint. 56yo with long h/o gerd on high dose protonix per GI reports water brash symptoms at night. H/o esophageal stenosis requiring dilations in past. No pain, hematemesis or nausea.  H/o prehypertension with elevated bp in office today. Doesn't check bp at home. Reports ate pork rinds recently.    Assessment  1. Gastroesophageal reflux disease, unspecified whether esophagitis present   2. Esophageal stricture   3. Prehypertension      Plan  GERD:  water brash sxs are bothersome. Continue PPI and pt to see her GI for further eval.  May warrant repeat EGD.  Elevated BP: pt to start monitoring bp at home. Will return if remains > 140/90. Low salt diet recommended.   Follow up: cpe  04/05/2022  No orders of the defined types were placed in this encounter.  Meds ordered this encounter  Medications   pantoprazole (PROTONIX) 40 MG tablet    Sig: Take 1 tablet (40 mg total) by mouth 2 (two) times daily.    Dispense:  180 tablet    Refill:  0      I reviewed the patients updated PMH, FH, and SocHx.    Patient Active Problem List   Diagnosis Date Noted   Prehypertension 04/16/2019    Priority: High   Obesity (BMI 30-39.9) 02/17/2019    Priority: High   Chronic pain syndrome 05/13/2017    Priority: High   Chronic narcotic use 05/13/2017    Priority: High   Mixed hyperlipidemia 05/13/2017    Priority: High   Localized osteoarthrosis of left hip 05/13/2017    Priority: High   Degenerative joint disease (DJD) of lumbar spine 05/13/2017    Priority: High   Family history of colon cancer in father 04/25/2011    Priority: High    Gastroesophageal reflux disease 10/16/2011    Priority: Medium    Esophageal stricture 04/25/2011    Priority: Medium    Cigar smoker unmotivated to quit 02/18/2020    Priority: Low   Metatarsalgia of both feet 02/12/2018    Priority: Low   History of Helicobacter pylori infection 04/30/2021   Regurgitation of food 040/81/4481  Helicobacter pylori antibody positive 09/29/2020   No vaccination-pt refuse 02/18/2020   Current Meds  Medication Sig   cholecalciferol (VITAMIN D3) 25 MCG (1000 UNIT) tablet Take 1,000 Units by mouth daily.   medroxyPROGESTERone (PROVERA) 10 MG tablet TAKE 1 TABLET BY MOUTH ONCE DAILY x TEN DAYS EACH MONTH.   oxyCODONE-acetaminophen (PERCOCET) 7.5-325 MG tablet Take 1 tablet by mouth 2 (two) times daily as needed for moderate pain.   pravastatin (PRAVACHOL) 40 MG tablet Take 1 tablet (40 mg total) by mouth at bedtime.   [DISCONTINUED] IBU 800 MG tablet Take 800 mg by mouth every 8 (eight) hours as needed for moderate pain.   [DISCONTINUED] pantoprazole (PROTONIX) 40 MG tablet TAKE (1) TABLET BY MOUTH TWICE A DAY.    Allergies: Patient has No Known Allergies. Family History: Patient family history includes Arthritis in her mother; Cancer in her father; Colon cancer in her father; Diabetes in her brother, brother,  father, and mother; GER disease in her son; Hyperlipidemia in her brother and father; Hypertension in her brother, brother, and father; Thrombosis in her mother. Social History:  Patient  reports that she has been smoking cigars. She has never used smokeless tobacco. She reports current alcohol use. She reports that she does not currently use drugs after having used the following drugs: Marijuana.  Review of Systems: Constitutional: Negative for fever malaise or anorexia Cardiovascular: negative for chest pain Respiratory: negative for SOB or persistent cough Gastrointestinal: negative for abdominal pain  Objective  Vitals: BP (!) 142/90   Pulse  94   Temp 97.7 F (36.5 C)   Ht '5\' 2"'$  (1.575 m)   Wt 204 lb (92.5 kg)   SpO2 99%   BMI 37.31 kg/m  General: no acute distress , A&Ox3 Abd: soft, nontender    Commons side effects, risks, benefits, and alternatives for medications and treatment plan prescribed today were discussed, and the patient expressed understanding of the given instructions. Patient is instructed to call or message via MyChart if he/she has any questions or concerns regarding our treatment plan. No barriers to understanding were identified. We discussed Red Flag symptoms and signs in detail. Patient expressed understanding regarding what to do in case of urgent or emergency type symptoms.  Medication list was reconciled, printed and provided to the patient in AVS. Patient instructions and summary information was reviewed with the patient as documented in the AVS. This note was prepared with assistance of Dragon voice recognition software. Occasional wrong-word or sound-a-like substitutions may have occurred due to the inherent limitations of voice recognition software  This visit occurred during the SARS-CoV-2 public health emergency.  Safety protocols were in place, including screening questions prior to the visit, additional usage of staff PPE, and extensive cleaning of exam room while observing appropriate contact time as indicated for disinfecting solutions.

## 2021-12-09 ENCOUNTER — Other Ambulatory Visit: Payer: Self-pay | Admitting: Family Medicine

## 2021-12-09 DIAGNOSIS — K219 Gastro-esophageal reflux disease without esophagitis: Secondary | ICD-10-CM

## 2021-12-10 MED ORDER — PANTOPRAZOLE SODIUM 40 MG PO TBEC: 40.0000 mg | DELAYED_RELEASE_TABLET | Freq: Two times a day (BID) | ORAL | 0 refills | Status: DC

## 2022-01-17 ENCOUNTER — Ambulatory Visit (INDEPENDENT_AMBULATORY_CARE_PROVIDER_SITE_OTHER): Payer: Medicare HMO | Admitting: Gastroenterology

## 2022-01-17 ENCOUNTER — Encounter: Payer: Self-pay | Admitting: Gastroenterology

## 2022-01-17 VITALS — BP 121/78 | HR 87 | Temp 97.7°F | Ht 62.0 in | Wt 202.5 lb

## 2022-01-17 DIAGNOSIS — K219 Gastro-esophageal reflux disease without esophagitis: Secondary | ICD-10-CM | POA: Diagnosis not present

## 2022-01-17 DIAGNOSIS — M539 Dorsopathy, unspecified: Secondary | ICD-10-CM | POA: Diagnosis not present

## 2022-01-17 DIAGNOSIS — Z79899 Other long term (current) drug therapy: Secondary | ICD-10-CM | POA: Diagnosis not present

## 2022-01-17 DIAGNOSIS — Z8619 Personal history of other infectious and parasitic diseases: Secondary | ICD-10-CM | POA: Diagnosis not present

## 2022-01-17 DIAGNOSIS — R030 Elevated blood-pressure reading, without diagnosis of hypertension: Secondary | ICD-10-CM | POA: Diagnosis not present

## 2022-01-17 DIAGNOSIS — R69 Illness, unspecified: Secondary | ICD-10-CM | POA: Diagnosis not present

## 2022-01-17 DIAGNOSIS — R1110 Vomiting, unspecified: Secondary | ICD-10-CM

## 2022-01-17 DIAGNOSIS — M1990 Unspecified osteoarthritis, unspecified site: Secondary | ICD-10-CM | POA: Diagnosis not present

## 2022-01-17 DIAGNOSIS — G894 Chronic pain syndrome: Secondary | ICD-10-CM | POA: Diagnosis not present

## 2022-01-17 DIAGNOSIS — Z6837 Body mass index (BMI) 37.0-37.9, adult: Secondary | ICD-10-CM | POA: Diagnosis not present

## 2022-01-17 MED ORDER — FAMOTIDINE 40 MG PO TABS: 40.0000 mg | ORAL_TABLET | Freq: Every day | ORAL | 2 refills | Status: AC

## 2022-01-17 NOTE — Patient Instructions (Signed)
Follow a GERD diet:  Avoid fried, fatty, greasy, spicy, citrus foods. Avoid caffeine and carbonated beverages. Avoid chocolate. Try eating 4-6 small meals a day rather than 3 large meals. Do not eat within 3 hours of laying down. Prop head of bed up on wood or bricks to create a 6 inch incline.   Continue pantoprazole 40 mg twice daily, 30 minutes prior to breakfast and dinner.  I want you to begin taking famotidine 40 mg nightly in addition to your pantoprazole.  You may take it within 30 minutes to an hour of going to bed to see if we can improve your nighttime symptoms.  It was a pleasure to meet you today!  I want to create trusting relationships with patients. If you receive a survey regarding your visit,  I greatly appreciate you taking time to fill this out on paper or through your MyChart. I value your feedback.  Venetia Night, MSN, FNP-BC, AGACNP-BC [facility]
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
## 2022-02-15 DIAGNOSIS — R030 Elevated blood-pressure reading, without diagnosis of hypertension: Secondary | ICD-10-CM | POA: Diagnosis not present

## 2022-02-15 DIAGNOSIS — M539 Dorsopathy, unspecified: Secondary | ICD-10-CM | POA: Diagnosis not present

## 2022-02-15 DIAGNOSIS — Z6836 Body mass index (BMI) 36.0-36.9, adult: Secondary | ICD-10-CM | POA: Diagnosis not present

## 2022-02-15 DIAGNOSIS — R69 Illness, unspecified: Secondary | ICD-10-CM | POA: Diagnosis not present

## 2022-02-15 DIAGNOSIS — G894 Chronic pain syndrome: Secondary | ICD-10-CM | POA: Diagnosis not present

## 2022-02-15 DIAGNOSIS — Z79899 Other long term (current) drug therapy: Secondary | ICD-10-CM | POA: Diagnosis not present

## 2022-02-15 DIAGNOSIS — M1990 Unspecified osteoarthritis, unspecified site: Secondary | ICD-10-CM | POA: Diagnosis not present

## 2022-03-08 DIAGNOSIS — R030 Elevated blood-pressure reading, without diagnosis of hypertension: Secondary | ICD-10-CM | POA: Diagnosis not present

## 2022-03-08 DIAGNOSIS — R5383 Other fatigue: Secondary | ICD-10-CM | POA: Diagnosis not present

## 2022-03-08 DIAGNOSIS — M129 Arthropathy, unspecified: Secondary | ICD-10-CM | POA: Diagnosis not present

## 2022-03-08 DIAGNOSIS — Z6836 Body mass index (BMI) 36.0-36.9, adult: Secondary | ICD-10-CM | POA: Diagnosis not present

## 2022-03-08 DIAGNOSIS — G894 Chronic pain syndrome: Secondary | ICD-10-CM | POA: Diagnosis not present

## 2022-03-08 DIAGNOSIS — Z6837 Body mass index (BMI) 37.0-37.9, adult: Secondary | ICD-10-CM | POA: Diagnosis not present

## 2022-03-08 DIAGNOSIS — Z79899 Other long term (current) drug therapy: Secondary | ICD-10-CM | POA: Diagnosis not present

## 2022-03-08 DIAGNOSIS — Z0000 Encounter for general adult medical examination without abnormal findings: Secondary | ICD-10-CM | POA: Diagnosis not present

## 2022-03-08 DIAGNOSIS — Z131 Encounter for screening for diabetes mellitus: Secondary | ICD-10-CM | POA: Diagnosis not present

## 2022-03-08 DIAGNOSIS — M47816 Spondylosis without myelopathy or radiculopathy, lumbar region: Secondary | ICD-10-CM | POA: Diagnosis not present

## 2022-03-08 DIAGNOSIS — E7800 Pure hypercholesterolemia, unspecified: Secondary | ICD-10-CM | POA: Diagnosis not present

## 2022-03-08 DIAGNOSIS — Z1159 Encounter for screening for other viral diseases: Secondary | ICD-10-CM | POA: Diagnosis not present

## 2022-03-18 DIAGNOSIS — M1990 Unspecified osteoarthritis, unspecified site: Secondary | ICD-10-CM | POA: Diagnosis not present

## 2022-03-18 DIAGNOSIS — M539 Dorsopathy, unspecified: Secondary | ICD-10-CM | POA: Diagnosis not present

## 2022-03-18 DIAGNOSIS — G894 Chronic pain syndrome: Secondary | ICD-10-CM | POA: Diagnosis not present

## 2022-03-18 DIAGNOSIS — R030 Elevated blood-pressure reading, without diagnosis of hypertension: Secondary | ICD-10-CM | POA: Diagnosis not present

## 2022-03-18 DIAGNOSIS — R69 Illness, unspecified: Secondary | ICD-10-CM | POA: Diagnosis not present

## 2022-03-18 DIAGNOSIS — Z79899 Other long term (current) drug therapy: Secondary | ICD-10-CM | POA: Diagnosis not present

## 2022-03-18 DIAGNOSIS — Z6837 Body mass index (BMI) 37.0-37.9, adult: Secondary | ICD-10-CM | POA: Diagnosis not present

## 2022-03-20 ENCOUNTER — Other Ambulatory Visit: Payer: Self-pay | Admitting: Family Medicine

## 2022-03-20 DIAGNOSIS — K219 Gastro-esophageal reflux disease without esophagitis: Secondary | ICD-10-CM

## 2022-03-22 DIAGNOSIS — Z79899 Other long term (current) drug therapy: Secondary | ICD-10-CM | POA: Diagnosis not present

## 2022-03-25 ENCOUNTER — Telehealth: Payer: Self-pay | Admitting: Family Medicine

## 2022-03-25 NOTE — Telephone Encounter (Signed)
Contacted Kelsey Grant to schedule their annual wellness visit. Appointment made for 04/01/2022.  [place] [facility] Dial [phone]

## 2022-03-28 DIAGNOSIS — Z1231 Encounter for screening mammogram for malignant neoplasm of breast: Secondary | ICD-10-CM | POA: Diagnosis not present

## 2022-03-28 DIAGNOSIS — R92333 Mammographic heterogeneous density, bilateral breasts: Secondary | ICD-10-CM | POA: Diagnosis not present

## 2022-03-29 LAB — HM MAMMOGRAPHY

## 2022-04-01 ENCOUNTER — Ambulatory Visit (INDEPENDENT_AMBULATORY_CARE_PROVIDER_SITE_OTHER): Payer: Medicare HMO

## 2022-04-01 VITALS — Wt 202.0 lb

## 2022-04-01 DIAGNOSIS — Z0000 Encounter for general adult medical examination without abnormal findings: Secondary | ICD-10-CM | POA: Diagnosis not present
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
## 2022-04-01 NOTE — Patient Instructions (Signed)
Kelsey Grant , Thank you for taking time to come for your Medicare Wellness Visit. I appreciate your ongoing commitment to your health goals. Please review the following plan we discussed and let me know if I can assist you in the future.   These are the goals we discussed:  Goals      Patient Stated     Get rid of the pain      Patient Stated     None at this time      Patient Stated     lose weight         This is a list of the screening recommended for you and due dates:  Health Maintenance  Topic Date Due   Zoster (Shingles) Vaccine (1 of 2) 07/02/2022*   Mammogram  04/10/2022   Medicare Annual Wellness Visit  04/01/2023   Pap Smear  04/13/2025   Colon Cancer Screening  06/02/2031   Hepatitis C Screening: USPSTF Recommendation to screen - Ages 18-79 yo.  Completed   HIV Screening  Completed   HPV Vaccine  Aged Out   DTaP/Tdap/Td vaccine  Discontinued   Flu Shot  Discontinued   COVID-19 Vaccine  Discontinued  *Topic was postponed. The date shown is not the original due date.    Advanced directives: Advance directive discussed with you today. Even though you declined this today please call our office should you change your mind and we can give you the proper paperwork for you to fill out.  Conditions/risks identified: lose a little weight   Next appointment: Follow up in one year for your annual wellness visit.   Preventive Care 40-64 Years, Female Preventive care refers to lifestyle choices and visits with your health care provider that can promote health and wellness. What does preventive care include? A yearly physical exam. This is also called an annual well check. Dental exams once or twice a year. Routine eye exams. Ask your health care provider how often you should have your eyes checked. Personal lifestyle choices, including: Daily care of your teeth and gums. Regular physical activity. Eating a healthy diet. Avoiding tobacco and drug use. Limiting  alcohol use. Practicing safe sex. Taking low-dose aspirin daily starting at age 71. Taking vitamin and mineral supplements as recommended by your health care provider. What happens during an annual well check? The services and screenings done by your health care provider during your annual well check will depend on your age, overall health, lifestyle risk factors, and family history of disease. Counseling  Your health care provider may ask you questions about your: Alcohol use. Tobacco use. Drug use. Emotional well-being. Home and relationship well-being. Sexual activity. Eating habits. Work and work Statistician. Method of birth control. Menstrual cycle. Pregnancy history. Screening  You may have the following tests or measurements: Height, weight, and BMI. Blood pressure. Lipid and cholesterol levels. These may be checked every 5 years, or more frequently if you are over 63 years old. Skin check. Lung cancer screening. You may have this screening every year starting at age 26 if you have a 30-pack-year history of smoking and currently smoke or have quit within the past 15 years. Fecal occult blood test (FOBT) of the stool. You may have this test every year starting at age 10. Flexible sigmoidoscopy or colonoscopy. You may have a sigmoidoscopy every 5 years or a colonoscopy every 10 years starting at age 84. Hepatitis C blood test. Hepatitis B blood test. Sexually transmitted disease (STD) testing. Diabetes screening.  This is done by checking your blood sugar (glucose) after you have not eaten for a while (fasting). You may have this done every 1-3 years. Mammogram. This may be done every 1-2 years. Talk to your health care provider about when you should start having regular mammograms. This may depend on whether you have a family history of breast cancer. BRCA-related cancer screening. This may be done if you have a family history of breast, ovarian, tubal, or peritoneal  cancers. Pelvic exam and Pap test. This may be done every 3 years starting at age 26. Starting at age 91, this may be done every 5 years if you have a Pap test in combination with an HPV test. Bone density scan. This is done to screen for osteoporosis. You may have this scan if you are at high risk for osteoporosis. Discuss your test results, treatment options, and if necessary, the need for more tests with your health care provider. Vaccines  Your health care provider may recommend certain vaccines, such as: Influenza vaccine. This is recommended every year. Tetanus, diphtheria, and acellular pertussis (Tdap, Td) vaccine. You may need a Td booster every 10 years. Zoster vaccine. You may need this after age 67. Pneumococcal 13-valent conjugate (PCV13) vaccine. You may need this if you have certain conditions and were not previously vaccinated. Pneumococcal polysaccharide (PPSV23) vaccine. You may need one or two doses if you smoke cigarettes or if you have certain conditions. Talk to your health care provider about which screenings and vaccines you need and how often you need them. This information is not intended to replace advice given to you by your health care provider. Make sure you discuss any questions you have with your health care provider. Document Released: 01/20/2015 Document Revised: 09/13/2015 Document Reviewed: 10/25/2014 Elsevier Interactive Patient Education  2017 [place] Prevention in the Home Falls can cause injuries. They can happen to people of all ages. There are many things you can do to make your home safe and to help prevent falls. What can I do on the outside of my home? Regularly fix the edges of walkways and driveways and fix any cracks. Remove anything that might make you trip as you walk through a door, such as a raised step or threshold. Trim any bushes or trees on the path to your home. Use bright outdoor lighting. Clear any walking paths of  anything that might make someone trip, such as rocks or tools. Regularly check to see if handrails are loose or broken. Make sure that both sides of any steps have handrails. Any raised decks and porches should have guardrails on the edges. Have any leaves, snow, or ice cleared regularly. Use sand or salt on walking paths during winter. Clean up any spills in your garage right away. This includes oil or grease spills. What can I do in the bathroom? Use night lights. Install grab bars by the toilet and in the tub and shower. Do not use towel bars as grab bars. Use non-skid mats or decals in the tub or shower. If you need to sit down in the shower, use a plastic, non-slip stool. Keep the floor dry. Clean up any water that spills on the floor as soon as it happens. Remove soap buildup in the tub or shower regularly. Attach bath mats securely with double-sided non-slip rug tape. Do not have throw rugs and other things on the floor that can make you trip. What can I do in the bedroom?  Use night lights. Make sure that you have a light by your bed that is easy to reach. Do not use any sheets or blankets that are too big for your bed. They should not hang down onto the floor. Have a firm chair that has side arms. You can use this for support while you get dressed. Do not have throw rugs and other things on the floor that can make you trip. What can I do in the kitchen? Clean up any spills right away. Avoid walking on wet floors. Keep items that you use a lot in easy-to-reach places. If you need to reach something above you, use a strong step stool that has a grab bar. Keep electrical cords out of the way. Do not use floor polish or wax that makes floors slippery. If you must use wax, use non-skid floor wax. Do not have throw rugs and other things on the floor that can make you trip. What can I do with my stairs? Do not leave any items on the stairs. Make sure that there are handrails on both  sides of the stairs and use them. Fix handrails that are broken or loose. Make sure that handrails are as long as the stairways. Check any carpeting to make sure that it is firmly attached to the stairs. Fix any carpet that is loose or worn. Avoid having throw rugs at the top or bottom of the stairs. If you do have throw rugs, attach them to the floor with carpet tape. Make sure that you have a light switch at the top of the stairs and the bottom of the stairs. If you do not have them, ask someone to add them for you. What else can I do to help prevent falls? Wear shoes that: Do not have high heels. Have rubber bottoms. Are comfortable and fit you well. Are closed at the toe. Do not wear sandals. If you use a stepladder: Make sure that it is fully opened. Do not climb a closed stepladder. Make sure that both sides of the stepladder are locked into place. Ask someone to hold it for you, if possible. Clearly mark and make sure that you can see: Any grab bars or handrails. First and last steps. Where the edge of each step is. Use tools that help you move around (mobility aids) if they are needed. These include: Canes. Walkers. Scooters. Crutches. Turn on the lights when you go into a dark area. Replace any light bulbs as soon as they burn out. Set up your furniture so you have a clear path. Avoid moving your furniture around. If any of your floors are uneven, fix them. If there are any pets around you, be aware of where they are. Review your medicines with your doctor. Some medicines can make you feel dizzy. This can increase your chance of falling. Ask your doctor what other things that you can do to help prevent falls. This information is not intended to replace advice given to you by your health care provider. Make sure you discuss any questions you have with your health care provider. Document Released: 10/20/2008 Document Revised: 06/01/2015 Document Reviewed: 01/28/2014 Elsevier  Interactive Patient Education  2017 [employer].

## 2022-04-11 ENCOUNTER — Encounter: Payer: Self-pay | Admitting: Gastroenterology

## 2022-04-16 DIAGNOSIS — M1990 Unspecified osteoarthritis, unspecified site: Secondary | ICD-10-CM | POA: Diagnosis not present

## 2022-04-16 DIAGNOSIS — Z79899 Other long term (current) drug therapy: Secondary | ICD-10-CM | POA: Diagnosis not present

## 2022-04-16 DIAGNOSIS — R030 Elevated blood-pressure reading, without diagnosis of hypertension: Secondary | ICD-10-CM | POA: Diagnosis not present

## 2022-04-16 DIAGNOSIS — R69 Illness, unspecified: Secondary | ICD-10-CM | POA: Diagnosis not present

## 2022-04-16 DIAGNOSIS — Z6836 Body mass index (BMI) 36.0-36.9, adult: Secondary | ICD-10-CM | POA: Diagnosis not present

## 2022-04-16 DIAGNOSIS — G894 Chronic pain syndrome: Secondary | ICD-10-CM | POA: Diagnosis not present

## 2022-04-16 DIAGNOSIS — M539 Dorsopathy, unspecified: Secondary | ICD-10-CM | POA: Diagnosis not present

## 2022-04-23 DIAGNOSIS — Z79899 Other long term (current) drug therapy: Secondary | ICD-10-CM | POA: Diagnosis not present

## 2022-05-06 ENCOUNTER — Ambulatory Visit (INDEPENDENT_AMBULATORY_CARE_PROVIDER_SITE_OTHER): Payer: Medicare HMO | Admitting: Family Medicine

## 2022-05-06 ENCOUNTER — Encounter: Payer: Self-pay | Admitting: Family Medicine

## 2022-05-06 VITALS — BP 126/80 | HR 92 | Temp 98.0°F | Ht 62.0 in | Wt 192.8 lb

## 2022-05-06 DIAGNOSIS — L0201 Cutaneous abscess of face: Secondary | ICD-10-CM

## 2022-05-06 DIAGNOSIS — M79609 Pain in unspecified limb: Secondary | ICD-10-CM | POA: Diagnosis not present

## 2022-05-06 DIAGNOSIS — L731 Pseudofolliculitis barbae: Secondary | ICD-10-CM | POA: Diagnosis not present

## 2022-05-06 DIAGNOSIS — R202 Paresthesia of skin: Secondary | ICD-10-CM

## 2022-05-06 MED ORDER — CEPHALEXIN 500 MG PO CAPS: 500.0000 mg | ORAL_CAPSULE | Freq: Two times a day (BID) | ORAL | 0 refills | Status: DC

## 2022-05-06 NOTE — Progress Notes (Signed)
Subjective  CC:  Chief Complaint  Patient presents with   swollen chin   Arm Pain    Lt are pain for the past 2 weeks     HPI: Kelsey Grant is a 57 y.o. female who presents to the office today to address the problems listed above in the chief complaint. Complains of left chin pain and swelling.  Has had an ingrown hair.  Use some ice this morning so swelling is down.  No systemic symptoms.  No fevers.  No drainage or warmth Complains of inner forearm sensitivity/pain.  Sensitivity/pain.  Notes that it is very sensitive to touch.  Center when she leans on something.  No pain with use.  No limitations.  No weakness.  No injuries.  No rash.  Assessment  1. Ingrown hair   2. Abscess of chin   3. Paresthesia and pain of left extremity      Plan  Ingrown hair with infection: Treat with warm compresses, Keflex 500 twice daily x 7 days and Advil if needed. Paresthesias: Normal exam.  Has had history of same with chronic low back pain.  Will monitor.  Advil if needed.  She has a complete physical next month.  Will add lab work at that time for further workup if symptoms persist.  Follow up: For complete physical 05/20/2022  No orders of the defined types were placed in this encounter.  Meds ordered this encounter  Medications   cephALEXin (KEFLEX) 500 MG capsule    Sig: Take 1 capsule (500 mg total) by mouth 2 (two) times daily.    Dispense:  14 capsule    Refill:  0      I reviewed the patients updated PMH, FH, and SocHx.    Patient Active Problem List   Diagnosis Date Noted   Prehypertension 04/16/2019    Priority: High   Obesity (BMI 30-39.9) 02/17/2019    Priority: High   Chronic pain syndrome 05/13/2017    Priority: High   Chronic narcotic use 05/13/2017    Priority: High   Mixed hyperlipidemia 05/13/2017    Priority: High   Localized osteoarthrosis of left hip 05/13/2017    Priority: High   Degenerative joint disease (DJD) of lumbar spine 05/13/2017     Priority: High   Family history of colon cancer in father 04/25/2011    Priority: High   Gastroesophageal reflux disease 10/16/2011    Priority: Medium    Esophageal stricture 04/25/2011    Priority: Medium    Cigar smoker unmotivated to quit 02/18/2020    Priority: Low   Metatarsalgia of both feet 02/12/2018    Priority: Low   History of Helicobacter pylori infection 04/30/2021   Regurgitation of food 09/29/2020   Helicobacter pylori antibody positive 09/29/2020   No vaccination-pt refuse 02/18/2020   Current Meds  Medication Sig   cephALEXin (KEFLEX) 500 MG capsule Take 1 capsule (500 mg total) by mouth 2 (two) times daily.   cholecalciferol (VITAMIN D3) 25 MCG (1000 UNIT) tablet Take 1,000 Units by mouth daily.   famotidine (PEPCID) 40 MG tablet Take 1 tablet (40 mg total) by mouth at bedtime.   ibuprofen (ADVIL) 800 MG tablet Take 800 mg by mouth 3 (three) times daily as needed.   medroxyPROGESTERone (PROVERA) 10 MG tablet TAKE 1 TABLET BY MOUTH ONCE DAILY x TEN DAYS EACH MONTH.   pantoprazole (PROTONIX) 40 MG tablet TAKE (1) TABLET BY MOUTH TWICE A DAY.   pravastatin (PRAVACHOL) 40  MG tablet Take 1 tablet (40 mg total) by mouth at bedtime.    Allergies: Patient has No Known Allergies. Family History: Patient family history includes Arthritis in her mother; Cancer in her father; Colon cancer in her father; Diabetes in her brother, brother, father, and mother; GER disease in her son; Hyperlipidemia in her brother and father; Hypertension in her brother, brother, and father; Thrombosis in her mother. Social History:  Patient  reports that she has been smoking cigars. She has never used smokeless tobacco. She reports current alcohol use. She reports that she does not currently use drugs after having used the following drugs: Marijuana.  Review of Systems: Constitutional: Negative for fever malaise or anorexia Cardiovascular: negative for chest pain Respiratory: negative for SOB  or persistent cough Gastrointestinal: negative for abdominal pain  Objective  Vitals: BP 126/80   Pulse 92   Temp 98 F (36.7 C)   Ht 5\' 2"  (1.575 m)   Wt 192 lb 12.8 oz (87.5 kg)   SpO2 98%   BMI 35.26 kg/m  General: no acute distress , A&Ox3 HEENT: PEERL, conjunctiva normal, neck is supple, no lymphadenopathy, left chin with small area with several hairs, none ingrown at the moment, small opening with surrounding tenderness, inflammation and swelling.  Nonfluctuant Left forearm is normal-appearing, nontender, sensitive to light touch throughout, normal distal strength, normal pulses.  Commons side effects, risks, benefits, and alternatives for medications and treatment plan prescribed today were discussed, and the patient expressed understanding of the given instructions. Patient is instructed to call or message via MyChart if he/she has any questions or concerns regarding our treatment plan. No barriers to understanding were identified. We discussed Red Flag symptoms and signs in detail. Patient expressed understanding regarding what to do in case of urgent or emergency type symptoms.  Medication list was reconciled, printed and provided to the patient in AVS. Patient instructions and summary information was reviewed with the patient as documented in the AVS. This note was prepared with assistance of Dragon voice recognition software. Occasional wrong-word or sound-a-like substitutions may have occurred due to the inherent limitations of voice recognition software

## 2022-05-06 NOTE — Patient Instructions (Signed)
Please follow up as scheduled for your next visit with me: 05/20/2022   If you have any questions or concerns, please don't hesitate to send me a message via MyChart or call the office at [phone]. Thank you for visiting with [place] today! It's our pleasure caring for you.   Complete the antibiotics and use warm compresses to your chin.

## 2022-05-11 ENCOUNTER — Other Ambulatory Visit: Payer: Self-pay | Admitting: Family Medicine

## 2022-05-16 DIAGNOSIS — G894 Chronic pain syndrome: Secondary | ICD-10-CM | POA: Diagnosis not present

## 2022-05-16 DIAGNOSIS — Z6835 Body mass index (BMI) 35.0-35.9, adult: Secondary | ICD-10-CM | POA: Diagnosis not present

## 2022-05-16 DIAGNOSIS — R030 Elevated blood-pressure reading, without diagnosis of hypertension: Secondary | ICD-10-CM | POA: Diagnosis not present

## 2022-05-16 DIAGNOSIS — F1120 Opioid dependence, uncomplicated: Secondary | ICD-10-CM | POA: Diagnosis not present

## 2022-05-16 DIAGNOSIS — Z79899 Other long term (current) drug therapy: Secondary | ICD-10-CM | POA: Diagnosis not present

## 2022-05-16 DIAGNOSIS — M1990 Unspecified osteoarthritis, unspecified site: Secondary | ICD-10-CM | POA: Diagnosis not present

## 2022-05-16 DIAGNOSIS — M539 Dorsopathy, unspecified: Secondary | ICD-10-CM | POA: Diagnosis not present

## 2022-05-20 ENCOUNTER — Encounter: Payer: Medicaid Other | Admitting: Family Medicine

## 2022-05-20 DIAGNOSIS — Z79899 Other long term (current) drug therapy: Secondary | ICD-10-CM | POA: Diagnosis not present

## 2022-05-24 ENCOUNTER — Encounter: Payer: Self-pay | Admitting: Family Medicine

## 2022-05-24 ENCOUNTER — Ambulatory Visit (INDEPENDENT_AMBULATORY_CARE_PROVIDER_SITE_OTHER): Payer: Medicare HMO | Admitting: Family Medicine

## 2022-05-24 VITALS — BP 130/80 | HR 98 | Temp 97.7°F | Ht 62.0 in | Wt 194.2 lb

## 2022-05-24 DIAGNOSIS — E782 Mixed hyperlipidemia: Secondary | ICD-10-CM | POA: Diagnosis not present

## 2022-05-24 DIAGNOSIS — F1190 Opioid use, unspecified, uncomplicated: Secondary | ICD-10-CM

## 2022-05-24 DIAGNOSIS — Z0000 Encounter for general adult medical examination without abnormal findings: Secondary | ICD-10-CM | POA: Diagnosis not present

## 2022-05-24 DIAGNOSIS — D126 Benign neoplasm of colon, unspecified: Secondary | ICD-10-CM | POA: Insufficient documentation

## 2022-05-24 DIAGNOSIS — Z800 Family history of malignant neoplasm of digestive organs: Secondary | ICD-10-CM | POA: Diagnosis not present

## 2022-05-24 DIAGNOSIS — G894 Chronic pain syndrome: Secondary | ICD-10-CM

## 2022-05-24 DIAGNOSIS — K219 Gastro-esophageal reflux disease without esophagitis: Secondary | ICD-10-CM

## 2022-05-24 DIAGNOSIS — F40298 Other specified phobia: Secondary | ICD-10-CM | POA: Insufficient documentation

## 2022-05-24 DIAGNOSIS — Z2821 Immunization not carried out because of patient refusal: Secondary | ICD-10-CM | POA: Diagnosis not present

## 2022-05-24 DIAGNOSIS — F17290 Nicotine dependence, other tobacco product, uncomplicated: Secondary | ICD-10-CM

## 2022-05-24 LAB — CBC WITH DIFFERENTIAL/PLATELET
Basophils Absolute: 0 10*3/uL (ref 0.0–0.1)
Basophils Relative: 0.2 % (ref 0.0–3.0)
Eosinophils Absolute: 0.1 10*3/uL (ref 0.0–0.7)
Eosinophils Relative: 1 % (ref 0.0–5.0)
HCT: 41.7 % (ref 36.0–46.0)
Hemoglobin: 13.9 g/dL (ref 12.0–15.0)
Lymphocytes Relative: 37.3 % (ref 12.0–46.0)
Lymphs Abs: 1.9 10*3/uL (ref 0.7–4.0)
MCHC: 33.4 g/dL (ref 30.0–36.0)
MCV: 95.2 fl (ref 78.0–100.0)
Monocytes Absolute: 0.4 10*3/uL (ref 0.1–1.0)
Monocytes Relative: 7.2 % (ref 3.0–12.0)
Neutro Abs: 2.8 10*3/uL (ref 1.4–7.7)
Neutrophils Relative %: 54.3 % (ref 43.0–77.0)
Platelets: 249 10*3/uL (ref 150.0–400.0)
RBC: 4.37 Mil/uL (ref 3.87–5.11)
RDW: 14.1 % (ref 11.5–15.5)
WBC: 5.1 10*3/uL (ref 4.0–10.5)

## 2022-05-24 LAB — LIPID PANEL
Cholesterol: 151 mg/dL (ref 0–200)
HDL: 52.6 mg/dL (ref >39.00)
LDL Cholesterol: 80 mg/dL (ref 0–99)
NonHDL: 98.4
Total CHOL/HDL Ratio: 3
Triglycerides: 94 mg/dL (ref 0.0–149.0)
VLDL: 18.8 mg/dL (ref 0.0–40.0)

## 2022-05-24 LAB — COMPREHENSIVE METABOLIC PANEL
ALT: 11 U/L (ref 0–35)
AST: 16 U/L (ref 0–37)
Albumin: 4.3 g/dL (ref 3.5–5.2)
Alkaline Phosphatase: 78 U/L (ref 39–117)
BUN: 15 mg/dL (ref 6–23)
CO2: 24 mEq/L (ref 19–32)
Calcium: 9.3 mg/dL (ref 8.4–10.5)
Chloride: 108 mEq/L (ref 96–112)
Creatinine, Ser: 0.85 mg/dL (ref 0.40–1.20)
GFR: 77.28 mL/min (ref >60.00)
Glucose, Bld: 93 mg/dL (ref 70–99)
Potassium: 3.5 mEq/L (ref 3.5–5.1)
Sodium: 144 mEq/L (ref 135–145)
Total Bilirubin: 0.5 mg/dL (ref 0.2–1.2)
Total Protein: 6.6 g/dL (ref 6.0–8.3)

## 2022-05-24 LAB — TSH: TSH: 2.37 u[IU]/mL (ref 0.35–5.50)

## 2022-05-24 NOTE — Progress Notes (Signed)
Subjective  Chief Complaint  Patient presents with   Annual Exam    Pt here for Annual Exam and is not currently fasting    HPI: Kelsey Grant is a 57 y.o. female who presents to [facility] [place] today for a Female Wellness Visit. She also has the concerns and/or needs as listed above in the chief complaint. These will be addressed in addition to the Health Maintenance Visit.   Wellness Visit: annual visit with health maintenance review and exam without Pap  Health maintenance: Patient sees GYN.  Mammogram done in March and normal.  Noted in chart.  Pap current.  Had colonoscopy last year, reviewed report and findings: Tubular adenoma, recommend repeat in 5 years.  Patient refusing immunizations.  She is eligible for Shingrix.  Needle phobia Chronic disease f/u and/or acute problem visit: (deemed necessary to be done in addition to the wellness visit): Prehypertension: Blood pressures have remained normal.  Weight is stable.  Down a few pounds from last year. Chronic back pain and chronic narcotic use per pain management.  Stable.  Patient trying to keep active.  Inquires about part-time work at [employer].  She is on full disability. Hyperlipidemia on pravastatin, tolerates well.  Nonfasting for recheck today. Smokes cigars.  Remains unmotivated to change that behavior. GERD: Reviewed GI notes.  On Protonix twice daily high-dose and Pepcid.  Symptoms are slowly improving.  H. pylori eradication noted last year.  No new symptoms.  No melena.  Assessment  1. Annual physical exam   2. Tubular adenoma of colon   3. Mixed hyperlipidemia   4. Chronic narcotic use   5. Family history of colon cancer in father   13. Cigar smoker unmotivated to quit   7. No vaccination-pt refuse   8. Needle phobia   9. Chronic pain syndrome   10. Gastroesophageal reflux disease, unspecified whether esophagitis present      Plan  Female Wellness Visit: Age appropriate Health  Maintenance and Prevention measures were discussed with patient. Included topics are cancer screening recommendations, ways to keep healthy (see AVS) including dietary and exercise recommendations, regular eye and dental care, use of seat belts, and avoidance of moderate alcohol use and tobacco use.  Creams are current. BMI: discussed patient's BMI and encouraged positive lifestyle modifications to help get to or maintain a target BMI. HM needs and immunizations were addressed and ordered. See below for orders. See HM and immunization section for updates.  Patient refuses immunizations Routine labs and screening tests ordered including cmp, cbc and lipids where appropriate. Discussed recommendations regarding Vit D and calcium supplementation (see AVS)  Chronic disease management visit and/or acute problem visit: Hyperlipidemia: Recheck nonfasting lipids and LFTs on pravastatin 40 mg nightly Continue low-salt diet Chronic narcotics per pain management. GERD: Continue Protonix 40 mg twice daily and Pepcid 20 mg nightly.  Has follow-up in 4 months with GI.  Will follow along. Tubular adenoma, new with family history of colon cancer.  Repeat colonoscopy in 5 years.  Follow up: 12 months for complete physical Orders Placed This Encounter  Procedures   CBC with Differential/Platelet   Comprehensive metabolic panel   Lipid panel   TSH   No orders of the defined types were placed in this encounter.     Body mass index is 35.52 kg/m. Wt Readings from Last 3 Encounters:  05/24/22 194 lb 3.2 oz (88.1 kg)  05/06/22 192 lb 12.8 oz (87.5 kg)  04/01/22 202 lb (  91.6 kg)     Patient Active Problem List   Diagnosis Date Noted   Tubular adenoma of colon 05/24/2022    Priority: High    Colonoscopy May 2023: -Nonbleeding internal hemorrhoids -Sigmoid diverticulosis -4 mm polyp in sigmoid colon (tubular adenoma) -Advised repeat TCS in 5 years    Prehypertension 04/16/2019    Priority: High    Obesity (BMI 30-39.9) 02/17/2019    Priority: High   Chronic pain syndrome 05/13/2017    Priority: High   Chronic narcotic use 05/13/2017    Priority: High   Mixed hyperlipidemia 05/13/2017    Priority: High   Localized osteoarthrosis of left hip 05/13/2017    Priority: High   Degenerative joint disease (DJD) of lumbar spine 05/13/2017    Priority: High   Family history of colon cancer in father 04/25/2011    Priority: High    Father, 76s    Gastroesophageal reflux disease 10/16/2011    Priority: Medium    Esophageal stricture 04/25/2011    Priority: Medium     Egd/dil jun 2013    History of Helicobacter pylori infection 04/30/2021    Priority: Low    Treated. Negative repeat testing 2023    Cigar smoker unmotivated to quit 02/18/2020    Priority: Low    occasional cigar    Metatarsalgia of both feet 02/12/2018    Priority: Low   Needle phobia 05/24/2022   No vaccination-pt refuse 02/18/2020   Health Maintenance  Topic Date Due   Zoster Vaccines- Shingrix (1 of 2) 07/02/2022 (Originally [birth year]1986)   MAMMOGRAM  03/29/2023   Medicare Annual Wellness (AWV)  04/01/2023   PAP SMEAR-Modifier  04/13/2025   COLONOSCOPY (Pts 45-3yrs Insurance coverage will need to be confirmed)  06/02/2026   Hepatitis C Screening  Completed   HIV Screening  Completed   HPV VACCINES  Aged Out   DTaP/Tdap/Td  Discontinued   INFLUENZA VACCINE  Discontinued   COVID-19 Vaccine  Discontinued    There is no immunization history on file for this patient. We updated and reviewed the patient's past history in detail and it is documented below. Allergies: Patient has No Known Allergies. Past Medical History Patient  has a past medical history of Arthritis, Blood transfusion without reported diagnosis (1996), Chronic pain, GERD (gastroesophageal reflux disease), and Hypercholesteremia. Past Surgical History Patient  has a past surgical history that includes Esophagogastroduodenoscopy  (03/19/2006); Savory dilation (06/18/2011); Colonoscopy (06/2011); Tubal ligation; Cesarean section (1986); Esophagogastroduodenoscopy (05/2011); Esophagogastroduodenoscopy (06/2011); Colonoscopy with propofol (N/A, 06/01/2021); and polypectomy (06/01/2021). Family History: Patient family history includes Arthritis in her mother; Cancer in her father; Colon cancer in her father; Diabetes in her brother, brother, father, and mother; GER disease in her son; Hyperlipidemia in her brother and father; Hypertension in her brother, brother, and father; Thrombosis in her mother. Social History:  Patient  reports that she has been smoking cigars. She has never used smokeless tobacco. She reports current alcohol use. She reports that she does not currently use drugs after having used the following drugs: Marijuana.  Review of Systems: Constitutional: negative for fever or malaise Ophthalmic: negative for photophobia, double vision or loss of vision Cardiovascular: negative for chest pain, dyspnea on exertion, or new LE swelling Respiratory: negative for SOB or persistent cough Gastrointestinal: negative for abdominal pain, change in bowel habits or melena Genitourinary: negative for dysuria or gross hematuria, no abnormal uterine bleeding or disharge Musculoskeletal: negative for new gait disturbance or muscular weakness Integumentary: negative for new or  persistent rashes, no breast lumps Neurological: negative for TIA or stroke symptoms Psychiatric: negative for SI or delusions Allergic/Immunologic: negative for hives  Patient Care Team    Relationship Specialty Notifications Start End  Willow Ora, MD PCP - General Family Medicine  05/13/17   Lazaro Arms, MD Consulting Physician Obstetrics and Gynecology  05/13/17   Center, [facility] Medical  Pain Medicine  02/18/20   Lanelle Bal, DO Consulting Physician Gastroenterology  10/02/20     Objective  Vitals: BP 130/80   Pulse 98   Temp 97.7 F  (36.5 C)   Ht 5\' 2"  (1.575 m)   Wt 194 lb 3.2 oz (88.1 kg)   SpO2 98%   BMI 35.52 kg/m  General:  Well developed, well nourished, no acute distress  Psych:  Alert and orientedx3,normal mood and affect HEENT:  Normocephalic, atraumatic, non-icteric sclera,  supple neck without adenopathy, mass or thyromegaly Cardiovascular:  Normal S1, S2, RRR without gallop, rub or murmur Respiratory:  Good breath sounds bilaterally, CTAB with normal respiratory effort Gastrointestinal: normal bowel sounds, soft, non-tender, no noted masses. No HSM MSK: extremities without edema, joints without erythema or swelling Neurologic:    Mental status is normal.  Gross motor and sensory exams are normal.  No tremor  Commons side effects, risks, benefits, and alternatives for medications and treatment plan prescribed today were discussed, and the patient expressed understanding of the given instructions. Patient is instructed to call or message via MyChart if he/she has any questions or concerns regarding our treatment plan. No barriers to understanding were identified. We discussed Red Flag symptoms and signs in detail. Patient expressed understanding regarding what to do in case of urgent or emergency type symptoms.  Medication list was reconciled, printed and provided to the patient in AVS. Patient instructions and summary information was reviewed with the patient as documented in the AVS. This note was prepared with assistance of Dragon voice recognition software. Occasional wrong-word or sound-a-like substitutions may have occurred due to the inherent limitations of voice recognition software

## 2022-05-26 NOTE — Progress Notes (Signed)
Labs reviewed and all normal.  My chart note was sent to patient notifying them of normal lab results. The 10-year ASCVD risk score (Arnett DK, et al., 2019) is: 5.4%   Values used to calculate the score:     Age: 57 years     Sex: Female     Is Non-Hispanic African American: Yes     Diabetic: No     Tobacco smoker: Yes     Systolic Blood Pressure: 130 mmHg     Is BP treated: No     HDL Cholesterol: 52.6 mg/dL     Total Cholesterol: 151 mg/dL

## 2022-06-11 DIAGNOSIS — R030 Elevated blood-pressure reading, without diagnosis of hypertension: Secondary | ICD-10-CM | POA: Diagnosis not present

## 2022-06-11 DIAGNOSIS — R7303 Prediabetes: Secondary | ICD-10-CM | POA: Diagnosis not present

## 2022-06-11 DIAGNOSIS — G894 Chronic pain syndrome: Secondary | ICD-10-CM | POA: Diagnosis not present

## 2022-06-11 DIAGNOSIS — E7800 Pure hypercholesterolemia, unspecified: Secondary | ICD-10-CM | POA: Diagnosis not present

## 2022-06-11 DIAGNOSIS — Z6837 Body mass index (BMI) 37.0-37.9, adult: Secondary | ICD-10-CM | POA: Diagnosis not present

## 2022-06-11 DIAGNOSIS — Z6836 Body mass index (BMI) 36.0-36.9, adult: Secondary | ICD-10-CM | POA: Diagnosis not present

## 2022-06-12 DIAGNOSIS — R030 Elevated blood-pressure reading, without diagnosis of hypertension: Secondary | ICD-10-CM | POA: Diagnosis not present

## 2022-06-12 DIAGNOSIS — M1990 Unspecified osteoarthritis, unspecified site: Secondary | ICD-10-CM | POA: Diagnosis not present

## 2022-06-12 DIAGNOSIS — G894 Chronic pain syndrome: Secondary | ICD-10-CM | POA: Diagnosis not present

## 2022-06-12 DIAGNOSIS — F1120 Opioid dependence, uncomplicated: Secondary | ICD-10-CM | POA: Diagnosis not present

## 2022-06-12 DIAGNOSIS — Z79899 Other long term (current) drug therapy: Secondary | ICD-10-CM | POA: Diagnosis not present

## 2022-06-12 DIAGNOSIS — M539 Dorsopathy, unspecified: Secondary | ICD-10-CM | POA: Diagnosis not present

## 2022-06-12 DIAGNOSIS — Z6835 Body mass index (BMI) 35.0-35.9, adult: Secondary | ICD-10-CM | POA: Diagnosis not present

## 2022-07-02 ENCOUNTER — Other Ambulatory Visit: Payer: Self-pay | Admitting: Family Medicine

## 2022-07-02 ENCOUNTER — Other Ambulatory Visit: Payer: Self-pay | Admitting: Obstetrics & Gynecology

## 2022-07-02 DIAGNOSIS — K219 Gastro-esophageal reflux disease without esophagitis: Secondary | ICD-10-CM
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
## 2022-07-09 ENCOUNTER — Encounter: Payer: Self-pay | Admitting: Gastroenterology

## 2022-07-10 ENCOUNTER — Ambulatory Visit (INDEPENDENT_AMBULATORY_CARE_PROVIDER_SITE_OTHER): Payer: Medicare HMO | Admitting: Gastroenterology

## 2022-07-10 ENCOUNTER — Encounter: Payer: Self-pay | Admitting: Gastroenterology

## 2022-07-10 VITALS — BP 129/73 | HR 101 | Temp 98.2°F | Ht 62.0 in | Wt 191.8 lb

## 2022-07-10 DIAGNOSIS — R1310 Dysphagia, unspecified: Secondary | ICD-10-CM | POA: Diagnosis not present

## 2022-07-10 DIAGNOSIS — K219 Gastro-esophageal reflux disease without esophagitis: Secondary | ICD-10-CM | POA: Diagnosis not present

## 2022-07-10 MED ORDER — DEXLANSOPRAZOLE 60 MG PO CPDR
60.0000 mg | DELAYED_RELEASE_CAPSULE | Freq: Every day | ORAL | 3 refills | Status: DC
Start: 2022-07-10 — End: 2022-10-01

## 2022-07-10 NOTE — Patient Instructions (Signed)
Stop pantoprazole and famotidine.  Start Dexilant 60 mg daily first thing in the morning for reflux.  Follow a GERD diet:  Avoid fried, fatty, greasy, spicy, citrus foods. Avoid caffeine and carbonated beverages. Avoid chocolate. Try eating 4-6 small meals a day rather than 3 large meals. Do not eat within 3 hours of laying down. Prop head of bed up on wood or bricks to create a 6 inch incline.  We are arranging for you to have a swallow study at [facility] to further evaluate your swallowing problems.  We will be in contact with you about these results.  We will plan to follow-up in the office in 3 months or sooner if needed.  It was great to see you today!  Ermalinda Memos, PA-C [facility] Gastroenterology

## 2022-07-11 ENCOUNTER — Encounter: Payer: Self-pay | Admitting: Gastroenterology

## 2022-07-16 DIAGNOSIS — G894 Chronic pain syndrome: Secondary | ICD-10-CM | POA: Diagnosis not present

## 2022-07-16 DIAGNOSIS — M539 Dorsopathy, unspecified: Secondary | ICD-10-CM | POA: Diagnosis not present

## 2022-07-16 DIAGNOSIS — Z6835 Body mass index (BMI) 35.0-35.9, adult: Secondary | ICD-10-CM | POA: Diagnosis not present

## 2022-07-16 DIAGNOSIS — R030 Elevated blood-pressure reading, without diagnosis of hypertension: Secondary | ICD-10-CM | POA: Diagnosis not present

## 2022-07-16 DIAGNOSIS — Z79899 Other long term (current) drug therapy: Secondary | ICD-10-CM | POA: Diagnosis not present

## 2022-07-16 DIAGNOSIS — M1990 Unspecified osteoarthritis, unspecified site: Secondary | ICD-10-CM | POA: Diagnosis not present

## 2022-07-16 DIAGNOSIS — F1120 Opioid dependence, uncomplicated: Secondary | ICD-10-CM | POA: Diagnosis not present

## 2022-07-19 ENCOUNTER — Ambulatory Visit (HOSPITAL_COMMUNITY)
Admission: RE | Admit: 2022-07-19 | Discharge: 2022-07-19 | Disposition: A | Discharge status: Home / Self Care | Payer: Medicare HMO | Source: Ambulatory Visit | Attending: Gastroenterology | Admitting: Gastroenterology

## 2022-07-19 DIAGNOSIS — R1310 Dysphagia, unspecified: Secondary | ICD-10-CM | POA: Insufficient documentation

## 2022-07-19 DIAGNOSIS — K219 Gastro-esophageal reflux disease without esophagitis: Secondary | ICD-10-CM | POA: Diagnosis not present

## 2022-07-19 DIAGNOSIS — K2289 Other specified disease of esophagus: Secondary | ICD-10-CM | POA: Diagnosis not present

## 2022-07-19 DIAGNOSIS — Z79899 Other long term (current) drug therapy: Secondary | ICD-10-CM | POA: Diagnosis not present

## 2022-08-07 ENCOUNTER — Encounter: Payer: Self-pay | Admitting: *Deleted

## 2022-08-08 ENCOUNTER — Encounter: Payer: Self-pay | Admitting: *Deleted

## 2022-08-12 ENCOUNTER — Encounter: Payer: Self-pay | Admitting: *Deleted

## 2022-08-16 DIAGNOSIS — M539 Dorsopathy, unspecified: Secondary | ICD-10-CM | POA: Diagnosis not present

## 2022-08-16 DIAGNOSIS — Z79899 Other long term (current) drug therapy: Secondary | ICD-10-CM | POA: Diagnosis not present

## 2022-08-16 DIAGNOSIS — F1120 Opioid dependence, uncomplicated: Secondary | ICD-10-CM | POA: Diagnosis not present

## 2022-08-16 DIAGNOSIS — M1990 Unspecified osteoarthritis, unspecified site: Secondary | ICD-10-CM | POA: Diagnosis not present

## 2022-08-16 DIAGNOSIS — R030 Elevated blood-pressure reading, without diagnosis of hypertension: Secondary | ICD-10-CM | POA: Diagnosis not present

## 2022-08-16 DIAGNOSIS — G894 Chronic pain syndrome: Secondary | ICD-10-CM | POA: Diagnosis not present

## 2022-08-16 DIAGNOSIS — Z6835 Body mass index (BMI) 35.0-35.9, adult: Secondary | ICD-10-CM | POA: Diagnosis not present

## 2022-08-23 DIAGNOSIS — Z79899 Other long term (current) drug therapy: Secondary | ICD-10-CM | POA: Diagnosis not present

## 2022-09-03 ENCOUNTER — Ambulatory Visit (HOSPITAL_BASED_OUTPATIENT_CLINIC_OR_DEPARTMENT_OTHER): Payer: Medicare HMO | Admitting: Certified Registered"

## 2022-09-03 ENCOUNTER — Encounter (HOSPITAL_COMMUNITY): Payer: Self-pay

## 2022-09-03 ENCOUNTER — Ambulatory Visit (HOSPITAL_COMMUNITY)
Admission: RE | Admit: 2022-09-03 | Discharge: 2022-09-03 | Disposition: A | Discharge status: Home / Self Care | Payer: Medicare HMO | Attending: Internal Medicine | Admitting: Internal Medicine

## 2022-09-03 ENCOUNTER — Ambulatory Visit (HOSPITAL_COMMUNITY): Payer: Medicare HMO | Admitting: Certified Registered"

## 2022-09-03 ENCOUNTER — Other Ambulatory Visit: Payer: Self-pay | Admitting: *Deleted

## 2022-09-03 ENCOUNTER — Other Ambulatory Visit: Payer: Self-pay

## 2022-09-03 ENCOUNTER — Encounter (HOSPITAL_COMMUNITY)
Admission: RE | Disposition: A | Discharge status: Home / Self Care | Payer: Self-pay | Source: Home / Self Care | Attending: Internal Medicine

## 2022-09-03 DIAGNOSIS — R1310 Dysphagia, unspecified: Secondary | ICD-10-CM

## 2022-09-03 DIAGNOSIS — F17290 Nicotine dependence, other tobacco product, uncomplicated: Secondary | ICD-10-CM | POA: Diagnosis not present

## 2022-09-03 DIAGNOSIS — K317 Polyp of stomach and duodenum: Secondary | ICD-10-CM | POA: Insufficient documentation

## 2022-09-03 DIAGNOSIS — Z793 Long term (current) use of hormonal contraceptives: Secondary | ICD-10-CM | POA: Diagnosis not present

## 2022-09-03 DIAGNOSIS — G8929 Other chronic pain: Secondary | ICD-10-CM | POA: Insufficient documentation

## 2022-09-03 DIAGNOSIS — K219 Gastro-esophageal reflux disease without esophagitis: Secondary | ICD-10-CM | POA: Diagnosis not present

## 2022-09-03 DIAGNOSIS — K220 Achalasia of cardia: Secondary | ICD-10-CM

## 2022-09-03 HISTORY — PX: POLYPECTOMY: SHX5525

## 2022-09-03 HISTORY — PX: ESOPHAGOGASTRODUODENOSCOPY (EGD) WITH PROPOFOL: SHX5813

## 2022-09-03 SURGERY — ESOPHAGOGASTRODUODENOSCOPY (EGD) WITH PROPOFOL
Anesthesia: General

## 2022-09-03 MED ORDER — LACTATED RINGERS IV SOLN: INTRAVENOUS | Status: DC

## 2022-09-03 MED ORDER — LACTATED RINGERS IV SOLN: INTRAVENOUS | Status: DC | PRN

## 2022-09-03 MED ORDER — LIDOCAINE HCL (CARDIAC) PF 100 MG/5ML IV SOSY
PREFILLED_SYRINGE | INTRAVENOUS | Status: DC | PRN
  Administered 2022-09-03: 100 mg via INTRAVENOUS

## 2022-09-03 MED ORDER — PROPOFOL 500 MG/50ML IV EMUL
INTRAVENOUS | Status: DC | PRN
  Administered 2022-09-03: 175 ug/kg/min via INTRAVENOUS

## 2022-09-03 MED ORDER — PROPOFOL 10 MG/ML IV BOLUS
INTRAVENOUS | Status: DC | PRN
  Administered 2022-09-03: 100 mg via INTRAVENOUS

## 2022-09-03 NOTE — Transfer of Care (Signed)
Immediate Anesthesia Transfer of Care Note  Patient: Kelsey Grant  Procedure(s) Performed: ESOPHAGOGASTRODUODENOSCOPY (EGD) WITH PROPOFOL POLYPECTOMY  Patient Location: Short Stay  Anesthesia Type:General  Level of Consciousness: drowsy and patient cooperative  Airway & Oxygen Therapy: Patient Spontanous Breathing and Patient connected to nasal cannula oxygen  Post-op Assessment: Report given to RN and Post -op Vital signs reviewed and stable  Post vital signs: Reviewed and stable  Last Vitals:  Vitals Value Taken Time  BP 125/78 09/03/22 1305  Temp 36.7 C 09/03/22 1305  Pulse 87 09/03/22 1305  Resp 19 09/03/22 1305  SpO2 94 % 09/03/22 1305    Last Pain:  Vitals:   09/03/22 1305  TempSrc: Oral  PainSc: 5       Patients Stated Pain Goal: 6 (09/03/22 1230)  Complications: No notable events documented.

## 2022-09-03 NOTE — Discharge Instructions (Addendum)
EGD Discharge instructions Please read the instructions outlined below and refer to this sheet in the next few weeks. These discharge instructions provide you with general information on caring for yourself after you leave the hospital. Your doctor may also give you specific instructions. While your treatment has been planned according to the most current medical practices available, unavoidable complications occasionally occur. If you have any problems or questions after discharge, please call your doctor. ACTIVITY You may resume your regular activity but move at a slower pace for the next 24 hours.  Take frequent rest periods for the next 24 hours.  Walking will help expel (get rid of) the air and reduce the bloated feeling in your abdomen.  No driving for 24 hours (because of the anesthesia (medicine) used during the test).  You may shower.  Do not sign any important legal documents or operate any machinery for 24 hours (because of the anesthesia used during the test).  NUTRITION Drink plenty of fluids.  You may resume your normal diet.  Begin with a light meal and progress to your normal diet.  Avoid alcoholic beverages for 24 hours or as instructed by your caregiver.  MEDICATIONS You may resume your normal medications unless your caregiver tells you otherwise.  WHAT YOU CAN EXPECT TODAY You may experience abdominal discomfort such as a feeling of fullness or "gas" pains.  FOLLOW-UP Your doctor will discuss the results of your test with you.  SEEK IMMEDIATE MEDICAL ATTENTION IF ANY OF THE FOLLOWING OCCUR: Excessive nausea (feeling sick to your stomach) and/or vomiting.  Severe abdominal pain and distention (swelling).  Trouble swallowing.  Temperature over 101 F (37.8 C).  Rectal bleeding or vomiting of blood.    Your EGD revealed findings consistent with a condition called achalasia.  By me passing the scope through your tight sphincter, it stretched it out.  Remaining of your  stomach and small bowel appeared normal.  You did have 1 small polyp in your small bowel which I removed.  We will call with these results.  I have reached out to surgeon in [place] about your case.  Likely next step would be performing manometry.  We will call you with further details in this regard.  Continue on Dexilant daily.  I hope you have a great rest of your week!  Hennie Duos. Marletta Lor, D.O. Gastroenterology and Hepatology [facility]

## 2022-09-03 NOTE — Anesthesia Preprocedure Evaluation (Signed)
Anesthesia Evaluation  Patient identified by MRN, date of birth, ID band Patient awake    Reviewed: Allergy & Precautions, H&P , NPO status , Patient's Chart, lab work & pertinent test results, reviewed documented beta blocker date and time   Airway Mallampati: II  TM Distance: >3 FB Neck ROM: full    Dental no notable dental hx.    Pulmonary neg pulmonary ROS, Current Smoker   Pulmonary exam normal breath sounds clear to auscultation       Cardiovascular Exercise Tolerance: Good negative cardio ROS  Rhythm:regular Rate:Normal     Neuro/Psych   Anxiety     negative neurological ROS  negative psych ROS   GI/Hepatic negative GI ROS, Neg liver ROS,GERD  ,,  Endo/Other  negative endocrine ROS    Renal/GU negative Renal ROS  negative genitourinary   Musculoskeletal   Abdominal   Peds  Hematology negative hematology ROS (+)   Anesthesia Other Findings   Reproductive/Obstetrics negative OB ROS                             Anesthesia Physical Anesthesia Plan  ASA: 2  Anesthesia Plan: General   Post-op Pain Management:    Induction:   PONV Risk Score and Plan: Propofol infusion  Airway Management Planned:   Additional Equipment:   Intra-op Plan:   Post-operative Plan:   Informed Consent: I have reviewed the patients History and Physical, chart, labs and discussed the procedure including the risks, benefits and alternatives for the proposed anesthesia with the patient or authorized representative who has indicated his/her understanding and acceptance.     Dental Advisory Given  Plan Discussed with: CRNA  Anesthesia Plan Comments:        Anesthesia Quick Evaluation

## 2022-09-03 NOTE — Op Note (Signed)
[facility] Patient Name: Kelsey Grant Procedure Date: 09/03/2022 12:36 PM MRN: [id number] Date of Birth: 04/08/1965 Attending MD: Hennie Duos. Marletta Lor , [place], [id number] CSN: [id number] Age: 57 Admit Type: Outpatient Procedure:                Upper GI endoscopy Indications:              Dysphagia, abn9ormal esophagram Providers:                Hennie Duos. Marletta Lor, DO, Angelica Ran, Zena Amos Referring MD:              Medicines:                See the Anesthesia note for documentation of the                            administered medications Complications:            No immediate complications. Estimated Blood Loss:     Estimated blood loss was minimal. Procedure:                Pre-Anesthesia Assessment:                           - The anesthesia plan was to use monitored                            anesthesia care (MAC).                           After obtaining informed consent, the endoscope was                            passed under direct vision. Throughout the                            procedure, the patient's blood pressure, pulse, and                            oxygen saturations were monitored continuously. The                            [id number]-H190 ([id number]) scope was introduced through the                            mouth, and advanced to the second part of duodenum.                            The upper GI endoscopy was accomplished without                            difficulty. The patient tolerated the procedure                            well. Scope In: 12:52:35 PM Scope Out: 12:58:27 PM Total Procedure Duration: 0 hours 5 minutes 52 seconds  Findings:      No appreciable esophageal motility was noted. Esophagus was  dilated       containing fluid. In addition, a hypertonic lower esophageal sphincter       was found. There was moderate resistance to endoscope advancement into       the stomach which caused mucosal disruption with the endoscope itself.        As such, further dilation not performed.      The entire examined stomach was normal.      A single 2 mm sessile polyp with no bleeding was found in the duodenal       bulb. The polyp was removed with a cold biopsy forceps. Resection and       retrieval were complete. Impression:               - The esophageal examination was consistent with                            achalasia.                           - Normal stomach.                           - A single duodenal polyp. Resected and retrieved. Moderate Sedation:      Per Anesthesia Care Recommendation:           - Patient has a contact number available for                            emergencies. The signs and symptoms of potential                            delayed complications were discussed with the                            patient. Return to normal activities tomorrow.                            Written discharge instructions were provided to the                            patient.                           - Resume previous diet.                           - Continue present medications.                           - Await pathology results.                           - Patient needs manometry and likely referral to                            [facility] care center to discuss POEM. Procedure Code(s):        --- Professional ---  16109, Esophagogastroduodenoscopy, flexible,                            transoral; with biopsy, single or multiple Diagnosis Code(s):        --- Professional ---                           K31.7, Polyp of stomach and duodenum                           R13.10, Dysphagia, unspecified CPT copyright 2022 American Medical Association. All rights reserved. The codes documented in this report are preliminary and upon coder review may  be revised to meet current compliance requirements. Hennie Duos. Marletta Lor, DO Hennie Duos. Marletta Lor, DO 09/03/2022 1:16:52 PM This report has been signed  electronically. Number of Addenda: 0
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
## 2022-09-04 ENCOUNTER — Encounter: Payer: Self-pay | Admitting: Family Medicine

## 2022-09-04 ENCOUNTER — Ambulatory Visit (INDEPENDENT_AMBULATORY_CARE_PROVIDER_SITE_OTHER): Payer: Medicare HMO | Admitting: Family Medicine

## 2022-09-04 VITALS — BP 138/88 | HR 92 | Temp 98.0°F | Ht 62.0 in | Wt 190.0 lb

## 2022-09-04 DIAGNOSIS — K220 Achalasia of cardia: Secondary | ICD-10-CM

## 2022-09-04 DIAGNOSIS — K219 Gastro-esophageal reflux disease without esophagitis: Secondary | ICD-10-CM | POA: Diagnosis not present

## 2022-09-04 DIAGNOSIS — G894 Chronic pain syndrome: Secondary | ICD-10-CM | POA: Diagnosis not present

## 2022-09-04 DIAGNOSIS — K222 Esophageal obstruction: Secondary | ICD-10-CM | POA: Diagnosis not present

## 2022-09-04 DIAGNOSIS — F1190 Opioid use, unspecified, uncomplicated: Secondary | ICD-10-CM | POA: Diagnosis not present

## 2022-09-04 LAB — SURGICAL PATHOLOGY

## 2022-09-04 NOTE — Anesthesia Postprocedure Evaluation (Signed)
Anesthesia Post Note  Patient: Kelsey Grant  Procedure(s) Performed: ESOPHAGOGASTRODUODENOSCOPY (EGD) WITH PROPOFOL POLYPECTOMY  Patient location during evaluation: Phase II Anesthesia Type: General Level of consciousness: awake Pain management: pain level controlled Vital Signs Assessment: post-procedure vital signs reviewed and stable Respiratory status: spontaneous breathing and respiratory function stable Cardiovascular status: blood pressure returned to baseline and stable Postop Assessment: no headache and no apparent nausea or vomiting Anesthetic complications: no Comments: Late entry   No notable events documented.   Last Vitals:  Vitals:   09/03/22 1230 09/03/22 1305  BP:  125/78  Pulse: 71 87  Resp: 19 19  Temp: 36.8 C 36.7 C  SpO2: 96% 94%    Last Pain:  Vitals:   09/03/22 1305  TempSrc: Oral  PainSc: 5                  Windell Norfolk

## 2022-09-04 NOTE — Progress Notes (Signed)
Subjective  CC:  Chief Complaint  Patient presents with   Abdominal Pain   Back Pain   Hip Pain     HPI: Kelsey Grant is a 57 y.o. female who presents to the office today to address the problems listed above in the chief complaint. Reviewed [facility] notes; most recent visit 08/16/2022. On oxy tid. Stable. Pt reports her provider is leaving the clinic and inquiring if I will manage her chronic pain  Reviewed EGD results from yesterday from GI. Achalsia s/p partial dilation. Duodenal polyp removed.   Assessment  1. Chronic pain syndrome   2. Chronic narcotic use   3. Esophageal stricture   4. Achalasia   5. Gastroesophageal reflux disease, unspecified whether esophagitis present      Plan  Chronic pain:  recommend continuation with new provide at [facility]. Pt agrees. I reviewed patient's records from the PMP aware controlled substance registry today.  S/p EGD: await bx results and continue GI meds.  Follow up: may for cpe Visit date not found  No orders of the defined types were placed in this encounter.  No orders of the defined types were placed in this encounter.     I reviewed the patients updated PMH, FH, and SocHx.    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    Priority: High   Family history of colon cancer in father 04/25/2011    Priority: High   Achalasia 09/04/2022    Priority: Medium    Gastroesophageal reflux disease 10/16/2011    Priority: Medium     Esophageal stricture 04/25/2011    Priority: Medium    History of Helicobacter pylori infection 04/30/2021    Priority: Low   Cigar smoker unmotivated to quit 02/18/2020    Priority: Low   Metatarsalgia of both feet 02/12/2018    Priority: Low   Needle phobia 05/24/2022   No vaccination-pt refuse 02/18/2020   Current Meds  Medication Sig   cholecalciferol (VITAMIN D3) 25 MCG (1000 UNIT) tablet Take 1,000 Units by mouth daily.   dexlansoprazole (DEXILANT) 60 MG capsule Take 1 capsule (60 mg total) by mouth daily.   famotidine (PEPCID) 40 MG tablet Take 1 tablet (40 mg total) by mouth at bedtime.   ibuprofen (ADVIL) 800 MG tablet Take 800 mg by mouth 3 (three) times daily as needed.   medroxyPROGESTERone (PROVERA) 10 MG tablet TAKE 1 TABLET BY MOUTH ONCE DAILY x TEN DAYS EACH MONTH.   oxyCODONE-acetaminophen (PERCOCET) 7.5-325 MG tablet Take 1 tablet by mouth 3 (three) times daily.    Allergies: Patient has No Known Allergies. Family History: Patient family history includes Arthritis in her mother; Cancer in her father; Colon cancer in her father; Diabetes in her brother, brother, father, and mother; GER disease in her son; Hyperlipidemia in her brother and father; Hypertension in her brother, brother, and father; Thrombosis in her mother. Social History:  Patient  reports that she has  been smoking cigars. She has never used smokeless tobacco. She reports current alcohol use. She reports that she does not currently use drugs after having used the following drugs: Marijuana.  Review of Systems: Constitutional: Negative for fever malaise or anorexia Cardiovascular: negative for chest pain Respiratory: negative for SOB or persistent cough Gastrointestinal: negative for abdominal pain  Objective  Vitals: BP 138/88   Pulse 92   Temp 98 F (36.7 C)   Ht 5\' 2"  (1.575 m)   Wt 190 lb (86.2 kg)   SpO2 97%   BMI 34.75 kg/m  General: no acute distress , A&Ox3   Commons side effects,  risks, benefits, and alternatives for medications and treatment plan prescribed today were discussed, and the patient expressed understanding of the given instructions. Patient is instructed to call or message via MyChart if he/she has any questions or concerns regarding our treatment plan. No barriers to understanding were identified. We discussed Red Flag symptoms and signs in detail. Patient expressed understanding regarding what to do in case of urgent or emergency type symptoms.  Medication list was reconciled, printed and provided to the patient in AVS. Patient instructions and summary information was reviewed with the patient as documented in the AVS. This note was prepared with assistance of Dragon voice recognition software. Occasional wrong-word or sound-a-like substitutions may have occurred due to the inherent limitations of voice recognition software

## 2022-09-05 ENCOUNTER — Encounter (HOSPITAL_COMMUNITY): Payer: Self-pay | Admitting: Internal Medicine

## 2022-09-16 DIAGNOSIS — R030 Elevated blood-pressure reading, without diagnosis of hypertension: Secondary | ICD-10-CM | POA: Diagnosis not present

## 2022-09-16 DIAGNOSIS — M539 Dorsopathy, unspecified: Secondary | ICD-10-CM | POA: Diagnosis not present

## 2022-09-16 DIAGNOSIS — R7303 Prediabetes: Secondary | ICD-10-CM | POA: Diagnosis not present

## 2022-09-16 DIAGNOSIS — F1120 Opioid dependence, uncomplicated: Secondary | ICD-10-CM | POA: Diagnosis not present

## 2022-09-16 DIAGNOSIS — Z789 Other specified health status: Secondary | ICD-10-CM | POA: Diagnosis not present

## 2022-09-16 DIAGNOSIS — Z0130 Encounter for examination of blood pressure without abnormal findings: Secondary | ICD-10-CM | POA: Diagnosis not present

## 2022-09-16 DIAGNOSIS — Z6834 Body mass index (BMI) 34.0-34.9, adult: Secondary | ICD-10-CM | POA: Diagnosis not present

## 2022-09-18 DIAGNOSIS — K220 Achalasia of cardia: Secondary | ICD-10-CM | POA: Diagnosis not present

## 2022-09-19 ENCOUNTER — Encounter: Payer: Self-pay | Admitting: Gastroenterology

## 2022-10-01 ENCOUNTER — Other Ambulatory Visit: Payer: Self-pay | Admitting: Gastroenterology

## 2022-10-01 ENCOUNTER — Telehealth: Payer: Self-pay | Admitting: *Deleted

## 2022-10-01 DIAGNOSIS — K219 Gastro-esophageal reflux disease without esophagitis: Secondary | ICD-10-CM

## 2022-10-01 MED ORDER — DEXLANSOPRAZOLE 60 MG PO CPDR
60.0000 mg | DELAYED_RELEASE_CAPSULE | Freq: Every day | ORAL | 5 refills | Status: DC
Start: 2022-10-01 — End: 2023-05-08

## 2022-10-01 NOTE — Telephone Encounter (Signed)
Pt needs a refill on Dexilant. Pt last OV 08/08/2022

## 2022-10-01 NOTE — Telephone Encounter (Signed)
Rx sent 

## 2022-10-16 DIAGNOSIS — Z0130 Encounter for examination of blood pressure without abnormal findings: Secondary | ICD-10-CM | POA: Diagnosis not present

## 2022-10-16 DIAGNOSIS — Z6834 Body mass index (BMI) 34.0-34.9, adult: Secondary | ICD-10-CM | POA: Diagnosis not present

## 2022-10-16 DIAGNOSIS — M539 Dorsopathy, unspecified: Secondary | ICD-10-CM | POA: Diagnosis not present

## 2022-10-16 DIAGNOSIS — F1120 Opioid dependence, uncomplicated: Secondary | ICD-10-CM | POA: Diagnosis not present

## 2022-10-16 DIAGNOSIS — Z789 Other specified health status: Secondary | ICD-10-CM | POA: Diagnosis not present

## 2022-10-16 DIAGNOSIS — E785 Hyperlipidemia, unspecified: Secondary | ICD-10-CM | POA: Diagnosis not present

## 2022-10-16 DIAGNOSIS — E6609 Other obesity due to excess calories: Secondary | ICD-10-CM | POA: Diagnosis not present

## 2022-10-16 DIAGNOSIS — R7303 Prediabetes: Secondary | ICD-10-CM | POA: Diagnosis not present

## 2022-10-29 ENCOUNTER — Ambulatory Visit: Payer: Medicare HMO

## 2022-11-06 ENCOUNTER — Ambulatory Visit: Payer: Medicare HMO

## 2022-11-08 ENCOUNTER — Other Ambulatory Visit: Payer: Self-pay

## 2022-11-08 ENCOUNTER — Other Ambulatory Visit: Payer: Self-pay | Admitting: Family Medicine

## 2022-11-08 MED ORDER — VITAMIN D 25 MCG (1000 UNIT) PO TABS
1000.0000 [IU] | ORAL_TABLET | Freq: Every day | ORAL | 1 refills | Status: AC
  Filled 2022-11-08: qty 30, 30d supply, fill #0

## 2022-11-09 DIAGNOSIS — R7303 Prediabetes: Secondary | ICD-10-CM | POA: Diagnosis not present

## 2022-11-09 DIAGNOSIS — F339 Major depressive disorder, recurrent, unspecified: Secondary | ICD-10-CM | POA: Diagnosis not present

## 2022-11-09 DIAGNOSIS — Z6834 Body mass index (BMI) 34.0-34.9, adult: Secondary | ICD-10-CM | POA: Diagnosis not present

## 2022-11-09 DIAGNOSIS — F1120 Opioid dependence, uncomplicated: Secondary | ICD-10-CM | POA: Diagnosis not present

## 2022-11-09 DIAGNOSIS — E7800 Pure hypercholesterolemia, unspecified: Secondary | ICD-10-CM | POA: Diagnosis not present

## 2022-11-09 DIAGNOSIS — M539 Dorsopathy, unspecified: Secondary | ICD-10-CM | POA: Diagnosis not present

## 2022-11-09 DIAGNOSIS — E6609 Other obesity due to excess calories: Secondary | ICD-10-CM | POA: Diagnosis not present

## 2022-11-09 DIAGNOSIS — R030 Elevated blood-pressure reading, without diagnosis of hypertension: Secondary | ICD-10-CM | POA: Diagnosis not present
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
## 2022-11-14 ENCOUNTER — Ambulatory Visit: Payer: Medicare HMO | Admitting: Gastroenterology

## 2022-11-14 ENCOUNTER — Encounter: Payer: Self-pay | Admitting: Gastroenterology

## 2022-11-14 VITALS — BP 135/83 | HR 82 | Temp 97.6°F | Ht 62.0 in | Wt 186.0 lb

## 2022-11-14 DIAGNOSIS — R1310 Dysphagia, unspecified: Secondary | ICD-10-CM

## 2022-11-14 DIAGNOSIS — K220 Achalasia of cardia: Secondary | ICD-10-CM | POA: Diagnosis not present

## 2022-11-14 DIAGNOSIS — K219 Gastro-esophageal reflux disease without esophagitis: Secondary | ICD-10-CM

## 2022-11-14 NOTE — Patient Instructions (Signed)
Continue Dexilant 60 mg daily and famotidine 40 mg at bedtime.  Keep your upcoming appointment in December with [facility] for further evaluation and management of achalasia.  Dysphagia precautions:  Eat slowly, take small bites, chew thoroughly, drink plenty of liquids throughout meals.  Avoid trough textures All meats should be chopped finely.   I will plan to see you back in the office in 6 months or sooner if needed.  I hope you have a Happy Thanksgiving and a [place] Christmas!  Ermalinda Memos, PA-C [facility]

## 2022-11-19 ENCOUNTER — Other Ambulatory Visit: Payer: Self-pay

## 2022-12-07 DIAGNOSIS — Z79899 Other long term (current) drug therapy: Secondary | ICD-10-CM | POA: Diagnosis not present

## 2022-12-11 DIAGNOSIS — Z79899 Other long term (current) drug therapy: Secondary | ICD-10-CM | POA: Diagnosis not present

## 2022-12-18 DIAGNOSIS — Z793 Long term (current) use of hormonal contraceptives: Secondary | ICD-10-CM | POA: Diagnosis not present

## 2022-12-18 DIAGNOSIS — E785 Hyperlipidemia, unspecified: Secondary | ICD-10-CM | POA: Diagnosis not present

## 2022-12-18 DIAGNOSIS — N926 Irregular menstruation, unspecified: Secondary | ICD-10-CM | POA: Diagnosis not present

## 2022-12-18 DIAGNOSIS — Z809 Family history of malignant neoplasm, unspecified: Secondary | ICD-10-CM | POA: Diagnosis not present

## 2022-12-18 DIAGNOSIS — K219 Gastro-esophageal reflux disease without esophagitis: Secondary | ICD-10-CM | POA: Diagnosis not present

## 2022-12-18 DIAGNOSIS — F17210 Nicotine dependence, cigarettes, uncomplicated: Secondary | ICD-10-CM | POA: Diagnosis not present

## 2022-12-18 DIAGNOSIS — Z008 Encounter for other general examination: Secondary | ICD-10-CM | POA: Diagnosis not present

## 2022-12-18 DIAGNOSIS — R030 Elevated blood-pressure reading, without diagnosis of hypertension: Secondary | ICD-10-CM | POA: Diagnosis not present

## 2022-12-18 DIAGNOSIS — K220 Achalasia of cardia: Secondary | ICD-10-CM | POA: Diagnosis not present

## 2022-12-18 DIAGNOSIS — Z8249 Family history of ischemic heart disease and other diseases of the circulatory system: Secondary | ICD-10-CM | POA: Diagnosis not present

## 2022-12-18 DIAGNOSIS — M1990 Unspecified osteoarthritis, unspecified site: Secondary | ICD-10-CM | POA: Diagnosis not present

## 2022-12-18 DIAGNOSIS — E669 Obesity, unspecified: Secondary | ICD-10-CM | POA: Diagnosis not present

## 2022-12-18 DIAGNOSIS — Z823 Family history of stroke: Secondary | ICD-10-CM | POA: Diagnosis not present

## 2022-12-19 DIAGNOSIS — K254 Chronic or unspecified gastric ulcer with hemorrhage: Secondary | ICD-10-CM | POA: Diagnosis not present

## 2022-12-19 DIAGNOSIS — K2289 Other specified disease of esophagus: Secondary | ICD-10-CM | POA: Diagnosis not present

## 2022-12-19 DIAGNOSIS — B3781 Candidal esophagitis: Secondary | ICD-10-CM | POA: Diagnosis not present

## 2022-12-19 DIAGNOSIS — K220 Achalasia of cardia: Secondary | ICD-10-CM | POA: Diagnosis not present

## 2022-12-19 DIAGNOSIS — R1314 Dysphagia, pharyngoesophageal phase: Secondary | ICD-10-CM | POA: Diagnosis not present

## 2022-12-19 DIAGNOSIS — K319 Disease of stomach and duodenum, unspecified: Secondary | ICD-10-CM | POA: Diagnosis not present

## 2022-12-19 DIAGNOSIS — K222 Esophageal obstruction: Secondary | ICD-10-CM | POA: Diagnosis not present

## 2022-12-19 DIAGNOSIS — K3189 Other diseases of stomach and duodenum: Secondary | ICD-10-CM | POA: Diagnosis not present

## 2022-12-19 DIAGNOSIS — R1319 Other dysphagia: Secondary | ICD-10-CM | POA: Diagnosis not present

## 2022-12-20 DIAGNOSIS — R1314 Dysphagia, pharyngoesophageal phase: Secondary | ICD-10-CM | POA: Diagnosis not present

## 2022-12-20 DIAGNOSIS — B3781 Candidal esophagitis: Secondary | ICD-10-CM | POA: Diagnosis not present

## 2022-12-20 DIAGNOSIS — K254 Chronic or unspecified gastric ulcer with hemorrhage: Secondary | ICD-10-CM | POA: Diagnosis not present

## 2022-12-20 DIAGNOSIS — K3189 Other diseases of stomach and duodenum: Secondary | ICD-10-CM | POA: Diagnosis not present

## 2022-12-20 DIAGNOSIS — K220 Achalasia of cardia: Secondary | ICD-10-CM | POA: Diagnosis not present

## 2022-12-20 DIAGNOSIS — K222 Esophageal obstruction: Secondary | ICD-10-CM | POA: Diagnosis not present

## 2022-12-20 DIAGNOSIS — K2289 Other specified disease of esophagus: Secondary | ICD-10-CM | POA: Diagnosis not present

## 2023-01-04 DIAGNOSIS — Z79899 Other long term (current) drug therapy: Secondary | ICD-10-CM | POA: Diagnosis not present

## 2023-01-15 DIAGNOSIS — K220 Achalasia of cardia: Secondary | ICD-10-CM | POA: Diagnosis not present

## 2023-01-30 DIAGNOSIS — Z79899 Other long term (current) drug therapy: Secondary | ICD-10-CM | POA: Diagnosis not present

## 2023-01-30 DIAGNOSIS — Z6833 Body mass index (BMI) 33.0-33.9, adult: Secondary | ICD-10-CM | POA: Diagnosis not present

## 2023-01-30 DIAGNOSIS — E6609 Other obesity due to excess calories: Secondary | ICD-10-CM | POA: Diagnosis not present

## 2023-01-30 DIAGNOSIS — M539 Dorsopathy, unspecified: Secondary | ICD-10-CM | POA: Diagnosis not present

## 2023-01-30 DIAGNOSIS — F1120 Opioid dependence, uncomplicated: Secondary | ICD-10-CM | POA: Diagnosis not present

## 2023-01-31 ENCOUNTER — Other Ambulatory Visit: Payer: Self-pay | Admitting: Family Medicine

## 2023-02-04 DIAGNOSIS — Z79899 Other long term (current) drug therapy: Secondary | ICD-10-CM | POA: Diagnosis not present

## 2023-02-28 DIAGNOSIS — N390 Urinary tract infection, site not specified: Secondary | ICD-10-CM | POA: Diagnosis not present

## 2023-02-28 DIAGNOSIS — Z79899 Other long term (current) drug therapy: Secondary | ICD-10-CM | POA: Diagnosis not present

## 2023-03-05 DIAGNOSIS — Z79899 Other long term (current) drug therapy: Secondary | ICD-10-CM | POA: Diagnosis not present

## 2023-03-28 DIAGNOSIS — Z79899 Other long term (current) drug therapy: Secondary | ICD-10-CM | POA: Diagnosis not present

## 2023-04-02 DIAGNOSIS — Z79899 Other long term (current) drug therapy: Secondary | ICD-10-CM | POA: Diagnosis not present

## 2023-04-08 DIAGNOSIS — R300 Dysuria: Secondary | ICD-10-CM | POA: Diagnosis not present

## 2023-04-08 DIAGNOSIS — R5383 Other fatigue: Secondary | ICD-10-CM | POA: Diagnosis not present

## 2023-04-08 DIAGNOSIS — E7800 Pure hypercholesterolemia, unspecified: Secondary | ICD-10-CM | POA: Diagnosis not present

## 2023-04-08 DIAGNOSIS — N390 Urinary tract infection, site not specified: Secondary | ICD-10-CM | POA: Diagnosis not present

## 2023-04-16 ENCOUNTER — Ambulatory Visit: Payer: Medicare HMO

## 2023-04-16 ENCOUNTER — Encounter: Payer: Self-pay | Admitting: Gastroenterology

## 2023-04-17 ENCOUNTER — Encounter: Admitting: Family Medicine

## 2023-04-17 ENCOUNTER — Encounter: Payer: Self-pay | Admitting: Family Medicine

## 2023-04-17 NOTE — Progress Notes (Signed)
 This encounter was created in error - please disregard.

## 2023-04-23 DIAGNOSIS — R7303 Prediabetes: Secondary | ICD-10-CM | POA: Diagnosis not present

## 2023-04-23 DIAGNOSIS — Z79899 Other long term (current) drug therapy: Secondary | ICD-10-CM | POA: Diagnosis not present

## 2023-04-28 DIAGNOSIS — Z79899 Other long term (current) drug therapy: Secondary | ICD-10-CM | POA: Diagnosis not present

## 2023-05-06 ENCOUNTER — Other Ambulatory Visit: Payer: Self-pay | Admitting: Gastroenterology

## 2023-05-06 DIAGNOSIS — K219 Gastro-esophageal reflux disease without esophagitis: Secondary | ICD-10-CM

## 2023-05-21 DIAGNOSIS — R030 Elevated blood-pressure reading, without diagnosis of hypertension: Secondary | ICD-10-CM | POA: Diagnosis not present

## 2023-05-21 DIAGNOSIS — F17210 Nicotine dependence, cigarettes, uncomplicated: Secondary | ICD-10-CM | POA: Diagnosis not present

## 2023-05-21 DIAGNOSIS — Z6833 Body mass index (BMI) 33.0-33.9, adult: Secondary | ICD-10-CM | POA: Diagnosis not present

## 2023-05-21 DIAGNOSIS — E6609 Other obesity due to excess calories: Secondary | ICD-10-CM | POA: Diagnosis not present

## 2023-05-21 DIAGNOSIS — M539 Dorsopathy, unspecified: Secondary | ICD-10-CM | POA: Diagnosis not present

## 2023-05-21 DIAGNOSIS — Z79899 Other long term (current) drug therapy: Secondary | ICD-10-CM | POA: Diagnosis not present

## 2023-05-28 DIAGNOSIS — Z79899 Other long term (current) drug therapy: Secondary | ICD-10-CM | POA: Diagnosis not present

## 2023-06-11 ENCOUNTER — Encounter: Admitting: Family Medicine

## 2023-06-23 DIAGNOSIS — Z79899 Other long term (current) drug therapy: Secondary | ICD-10-CM | POA: Diagnosis not present

## 2023-06-26 DIAGNOSIS — Z79899 Other long term (current) drug therapy: Secondary | ICD-10-CM | POA: Diagnosis not present

## 2023-07-01 DIAGNOSIS — F339 Major depressive disorder, recurrent, unspecified: Secondary | ICD-10-CM | POA: Diagnosis not present

## 2023-07-01 DIAGNOSIS — F17210 Nicotine dependence, cigarettes, uncomplicated: Secondary | ICD-10-CM | POA: Diagnosis not present

## 2023-07-01 DIAGNOSIS — G4710 Hypersomnia, unspecified: Secondary | ICD-10-CM | POA: Diagnosis not present

## 2023-07-01 DIAGNOSIS — Z6833 Body mass index (BMI) 33.0-33.9, adult: Secondary | ICD-10-CM | POA: Diagnosis not present

## 2023-07-01 DIAGNOSIS — M539 Dorsopathy, unspecified: Secondary | ICD-10-CM | POA: Diagnosis not present

## 2023-07-01 DIAGNOSIS — F1120 Opioid dependence, uncomplicated: Secondary | ICD-10-CM | POA: Diagnosis not present

## 2023-07-01 DIAGNOSIS — E6609 Other obesity due to excess calories: Secondary | ICD-10-CM | POA: Diagnosis not present

## 2023-07-23 DIAGNOSIS — Z79899 Other long term (current) drug therapy: Secondary | ICD-10-CM | POA: Diagnosis not present

## 2023-07-24 DIAGNOSIS — M549 Dorsalgia, unspecified: Secondary | ICD-10-CM | POA: Diagnosis not present

## 2023-07-24 DIAGNOSIS — D539 Nutritional anemia, unspecified: Secondary | ICD-10-CM | POA: Diagnosis not present

## 2023-07-24 DIAGNOSIS — Z79899 Other long term (current) drug therapy: Secondary | ICD-10-CM | POA: Diagnosis not present

## 2023-07-24 DIAGNOSIS — R5383 Other fatigue: Secondary | ICD-10-CM | POA: Diagnosis not present

## 2023-07-24 DIAGNOSIS — E7800 Pure hypercholesterolemia, unspecified: Secondary | ICD-10-CM | POA: Diagnosis not present

## 2023-07-24 DIAGNOSIS — R0602 Shortness of breath: Secondary | ICD-10-CM | POA: Diagnosis not present

## 2023-07-24 DIAGNOSIS — R7303 Prediabetes: Secondary | ICD-10-CM | POA: Diagnosis not present

## 2023-07-30 ENCOUNTER — Ambulatory Visit (INDEPENDENT_AMBULATORY_CARE_PROVIDER_SITE_OTHER)

## 2023-07-30 VITALS — Ht 62.0 in | Wt 182.0 lb

## 2023-07-30 DIAGNOSIS — Z0000 Encounter for general adult medical examination without abnormal findings: Secondary | ICD-10-CM | POA: Diagnosis not present

## 2023-07-30 DIAGNOSIS — Z1231 Encounter for screening mammogram for malignant neoplasm of breast: Secondary | ICD-10-CM

## 2023-07-30 NOTE — Patient Instructions (Addendum)
 Kelsey Grant , Thank you for taking time out of your busy schedule to complete your Annual Wellness Visit with me. I enjoyed our conversation and look forward to speaking with you again next year. I, as well as your care team,  appreciate your ongoing commitment to your health goals. Please review the following plan we discussed and let me know if I can assist you in the future. Your Game plan/ To Do List    Referrals: If you haven't heard from the office you've been referred to, please reach out to them at the phone provided.   Follow up Visits: Next Medicare AWV with our clinical staff: 08/04/24   Have you seen your provider in the last 6 months (3 months if uncontrolled diabetes)? No Next Office Visit with your provider: Pt will call to schedule    Clinician Recommendations:  Aim for 30 minutes of exercise or brisk walking, 6-8 glasses of water , and 5 servings of fruits and vegetables each day.      If you wish to quit smoking, help is available. For free tobacco cessation program offerings call the [facility] at 726-222-5701 or Live Well Line at 412-582-1587. You may also visit [email or web address] or email [email or web address]@[email or web address] .com for more information on other programs.   You may also call 1-800-QUIT-NOW (480-012-0939) or visit www.[place].ch or www.[facility].org for additional resources on smoking cessation.  You have an order for:  []   2D Mammogram  [x]   3D Mammogram  []   Bone Density     Please call for appointment:  [facility] [address] [place], [place] [address] [phone]  [facility] [address] [place], [place] [address] [phone]  [facility] [address] [place], [place] [address] [phone]  [facility] [place] [facility]ensity [address] [place], [place] [address] [phone]  [facility] [address] [place], [place] [address] [phone]    Make sure to wear two-piece clothing.  No lotions, powders, or deodorants the day of the appointment. Make sure to bring picture ID and insurance card.  Bring list of medications you are currently taking including any supplements.   This is a list of the screening recommended for you and due dates:  Health Maintenance  Topic Date Due   Pneumococcal Vaccination (1 of 2 - PCV) Never done   Hepatitis B Vaccine (1 of 3 - 19+ 3-dose series) Never done   Zoster (Shingles) Vaccine (1 of 2) Never done   Mammogram  03/29/2023   Medicare Annual Wellness Visit  04/01/2023   Pap with HPV screening  04/13/2025   Colon Cancer Screening  06/02/2026   Hepatitis C Screening  Completed   HIV Screening  Completed   HPV Vaccine  Aged Out   Meningitis B Vaccine  Aged Out   DTaP/Tdap/Td vaccine  Discontinued   Flu Shot  Discontinued   COVID-19 Vaccine  Discontinued    Advanced directives: (Declined) Advance directive discussed with you today. Even though you declined this today, please call our office should you change your mind, and we can give you the proper paperwork for you to fill out. Advance Care Planning is important because it:  [x]  Makes sure you receive the medical care that is consistent with your values, goals, and preferences  [x]  It provides guidance to your family and loved ones and reduces their decisional burden about whether or  not they are making the right decisions based on your wishes.  Follow the link provided in your after visit summary or read over the paperwork we have mailed to you to help you started getting your Advance Directives in place. If you need assistance in completing these, please reach out to us  so that we can help you!  See attachments for Preventive Care and Fall Prevention Tips.
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
## 2023-08-22 ENCOUNTER — Ambulatory Visit

## 2023-08-22 DIAGNOSIS — Z79899 Other long term (current) drug therapy: Secondary | ICD-10-CM | POA: Diagnosis not present

## 2023-08-27 DIAGNOSIS — Z79899 Other long term (current) drug therapy: Secondary | ICD-10-CM | POA: Diagnosis not present

## 2023-09-09 ENCOUNTER — Ambulatory Visit

## 2023-09-11 DIAGNOSIS — M6281 Muscle weakness (generalized): Secondary | ICD-10-CM | POA: Diagnosis not present

## 2023-09-11 DIAGNOSIS — M51362 Other intervertebral disc degeneration, lumbar region with discogenic back pain and lower extremity pain: Secondary | ICD-10-CM | POA: Diagnosis not present

## 2023-09-15 DIAGNOSIS — M6281 Muscle weakness (generalized): Secondary | ICD-10-CM | POA: Diagnosis not present

## 2023-09-15 DIAGNOSIS — M51362 Other intervertebral disc degeneration, lumbar region with discogenic back pain and lower extremity pain: Secondary | ICD-10-CM | POA: Diagnosis not present

## 2023-09-18 DIAGNOSIS — M6281 Muscle weakness (generalized): Secondary | ICD-10-CM | POA: Diagnosis not present

## 2023-09-18 DIAGNOSIS — M51362 Other intervertebral disc degeneration, lumbar region with discogenic back pain and lower extremity pain: Secondary | ICD-10-CM | POA: Diagnosis not present

## 2023-09-22 DIAGNOSIS — Z9181 History of falling: Secondary | ICD-10-CM | POA: Diagnosis not present

## 2023-09-22 DIAGNOSIS — M6281 Muscle weakness (generalized): Secondary | ICD-10-CM | POA: Diagnosis not present

## 2023-09-22 DIAGNOSIS — F17210 Nicotine dependence, cigarettes, uncomplicated: Secondary | ICD-10-CM | POA: Diagnosis not present

## 2023-09-22 DIAGNOSIS — M51362 Other intervertebral disc degeneration, lumbar region with discogenic back pain and lower extremity pain: Secondary | ICD-10-CM | POA: Diagnosis not present

## 2023-09-22 DIAGNOSIS — Z79899 Other long term (current) drug therapy: Secondary | ICD-10-CM | POA: Diagnosis not present

## 2023-09-22 DIAGNOSIS — M539 Dorsopathy, unspecified: Secondary | ICD-10-CM | POA: Diagnosis not present

## 2023-09-29 DIAGNOSIS — M6281 Muscle weakness (generalized): Secondary | ICD-10-CM | POA: Diagnosis not present

## 2023-09-29 DIAGNOSIS — M51362 Other intervertebral disc degeneration, lumbar region with discogenic back pain and lower extremity pain: Secondary | ICD-10-CM | POA: Diagnosis not present

## 2023-10-01 DIAGNOSIS — M51362 Other intervertebral disc degeneration, lumbar region with discogenic back pain and lower extremity pain: Secondary | ICD-10-CM | POA: Diagnosis not present

## 2023-10-01 DIAGNOSIS — M6281 Muscle weakness (generalized): Secondary | ICD-10-CM | POA: Diagnosis not present

## 2023-10-22 DIAGNOSIS — F17210 Nicotine dependence, cigarettes, uncomplicated: Secondary | ICD-10-CM | POA: Diagnosis not present

## 2023-10-22 DIAGNOSIS — M539 Dorsopathy, unspecified: Secondary | ICD-10-CM | POA: Diagnosis not present

## 2023-10-22 DIAGNOSIS — Z9181 History of falling: Secondary | ICD-10-CM | POA: Diagnosis not present

## 2023-10-22 DIAGNOSIS — R7303 Prediabetes: Secondary | ICD-10-CM | POA: Diagnosis not present

## 2023-10-22 DIAGNOSIS — E7800 Pure hypercholesterolemia, unspecified: Secondary | ICD-10-CM | POA: Diagnosis not present

## 2023-10-22 DIAGNOSIS — Z79899 Other long term (current) drug therapy: Secondary | ICD-10-CM | POA: Diagnosis not present

## 2023-10-25 ENCOUNTER — Other Ambulatory Visit: Payer: Self-pay | Admitting: Gastroenterology

## 2023-10-25 DIAGNOSIS — K219 Gastro-esophageal reflux disease without esophagitis: Secondary | ICD-10-CM

## 2023-10-27 ENCOUNTER — Encounter: Payer: Self-pay | Admitting: Family Medicine

## 2023-10-28 DIAGNOSIS — Z79899 Other long term (current) drug therapy: Secondary | ICD-10-CM | POA: Diagnosis not present

## 2024-03-24 ENCOUNTER — Ambulatory Visit: Admitting: Adult Health

## 2024-08-04 ENCOUNTER — Ambulatory Visit
